# Patient Record
Sex: Male | Born: 1975 | Race: Black or African American | Hispanic: No | Marital: Single | Smoking: Current every day smoker
Health system: Southern US, Community
[De-identification: ages and names within clinical notes are randomized; demographics above are authoritative.]

## PROBLEM LIST (undated history)

## (undated) DIAGNOSIS — M25551 Pain in right hip: Secondary | ICD-10-CM

## (undated) DIAGNOSIS — E669 Obesity, unspecified: Secondary | ICD-10-CM

## (undated) DIAGNOSIS — G4730 Sleep apnea, unspecified: Secondary | ICD-10-CM

## (undated) DIAGNOSIS — R011 Cardiac murmur, unspecified: Secondary | ICD-10-CM

## (undated) DIAGNOSIS — M5441 Lumbago with sciatica, right side: Secondary | ICD-10-CM

## (undated) DIAGNOSIS — M722 Plantar fascial fibromatosis: Secondary | ICD-10-CM

## (undated) DIAGNOSIS — E559 Vitamin D deficiency, unspecified: Secondary | ICD-10-CM

## (undated) DIAGNOSIS — B009 Herpesviral infection, unspecified: Secondary | ICD-10-CM

## (undated) DIAGNOSIS — R002 Palpitations: Secondary | ICD-10-CM

## (undated) DIAGNOSIS — M1990 Unspecified osteoarthritis, unspecified site: Secondary | ICD-10-CM

## (undated) DIAGNOSIS — K921 Melena: Secondary | ICD-10-CM

## (undated) DIAGNOSIS — R7303 Prediabetes: Secondary | ICD-10-CM

## (undated) DIAGNOSIS — K219 Gastro-esophageal reflux disease without esophagitis: Secondary | ICD-10-CM

## (undated) HISTORY — DX: Plantar fascial fibromatosis: M72.2

## (undated) HISTORY — DX: Herpesviral infection, unspecified: B00.9

## (undated) HISTORY — PX: OTHER SURGICAL HISTORY: SHX169

## (undated) HISTORY — DX: Unspecified osteoarthritis, unspecified site: M19.90

## (undated) HISTORY — DX: Pain in right hip: M25.551

## (undated) HISTORY — DX: Gastro-esophageal reflux disease without esophagitis: K21.9

## (undated) HISTORY — PX: NO PAST SURGERIES: SHX2092

## (undated) HISTORY — DX: Cardiac murmur, unspecified: R01.1

## (undated) HISTORY — DX: Vitamin D deficiency, unspecified: E55.9

## (undated) HISTORY — DX: Sleep apnea, unspecified: G47.30

---
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
## 2011-02-17 ENCOUNTER — Emergency Department: Payer: Self-pay | Admitting: Unknown Physician Specialty

## 2014-04-14 ENCOUNTER — Inpatient Hospital Stay: Payer: Self-pay | Admitting: Internal Medicine

## 2014-04-14 LAB — URINALYSIS, COMPLETE
BILIRUBIN, UR: NEGATIVE
BLOOD: NEGATIVE
Bacteria: NONE SEEN
Glucose,UR: NEGATIVE mg/dL (ref 0–75)
KETONE: NEGATIVE
Leukocyte Esterase: NEGATIVE
Nitrite: NEGATIVE
Ph: 5 (ref 4.5–8.0)
Protein: NEGATIVE
RBC,UR: 1 /HPF (ref 0–5)
Specific Gravity: 1.021 (ref 1.003–1.030)
Squamous Epithelial: NONE SEEN
WBC UR: <1 /HPF (ref 0–5)

## 2014-04-14 LAB — COMPREHENSIVE METABOLIC PANEL
ANION GAP: 4 — AB (ref 7–16)
AST: 20 U/L (ref 15–37)
Albumin: 3.7 g/dL (ref 3.4–5.0)
Alkaline Phosphatase: 61 U/L
BILIRUBIN TOTAL: 0.2 mg/dL (ref 0.2–1.0)
BUN: 13 mg/dL (ref 7–18)
CHLORIDE: 107 mmol/L (ref 98–107)
CREATININE: 1.19 mg/dL (ref 0.60–1.30)
Calcium, Total: 8.1 mg/dL — ABNORMAL LOW (ref 8.5–10.1)
Co2: 26 mmol/L (ref 21–32)
EGFR (Non-African Amer.): >60
GLUCOSE: 101 mg/dL — AB (ref 65–99)
Osmolality: 274 (ref 275–301)
POTASSIUM: 3.9 mmol/L (ref 3.5–5.1)
SGPT (ALT): 21 U/L (ref 12–78)
Sodium: 137 mmol/L (ref 136–145)
Total Protein: 6.9 g/dL (ref 6.4–8.2)

## 2014-04-14 LAB — CBC
HCT: 42.8 % (ref 40.0–52.0)
HGB: 13.5 g/dL (ref 13.0–18.0)
MCH: 24.9 pg — ABNORMAL LOW (ref 26.0–34.0)
MCHC: 31.5 g/dL — AB (ref 32.0–36.0)
MCV: 79 fL — AB (ref 80–100)
Platelet: 139 10*3/uL — ABNORMAL LOW (ref 150–440)
RBC: 5.42 10*6/uL (ref 4.40–5.90)
RDW: 14.8 % — AB (ref 11.5–14.5)
WBC: 8.4 10*3/uL (ref 3.8–10.6)

## 2014-04-14 LAB — CSF CELL COUNT WITH DIFFERENTIAL
CSF Tube #: 3
EOS PCT: 0 %
Lymphocytes: 93 %
MONOCYTES/MACROPHAGES: 6 %
NEUTROS PCT: 1 %
Other Cells: 0 %
RBC (CSF): 0 /mm3
WBC (CSF): 201 /mm3

## 2014-04-14 LAB — PROTEIN, CSF: Protein, CSF: 171 mg/dL — ABNORMAL HIGH (ref 15–45)

## 2014-04-14 LAB — CK: CK, TOTAL: 159 U/L

## 2014-04-14 LAB — GLUCOSE, CSF: Glucose, CSF: 57 mg/dL (ref 40–75)

## 2014-04-14 LAB — TROPONIN I: Troponin-I: <0.02 ng/mL

## 2014-04-15 LAB — CBC WITH DIFFERENTIAL/PLATELET
Basophil #: 0 10*3/uL (ref 0.0–0.1)
Basophil %: 0.4 %
EOS ABS: 0 10*3/uL (ref 0.0–0.7)
Eosinophil %: 0.3 %
HCT: 40.8 % (ref 40.0–52.0)
HGB: 12.8 g/dL — ABNORMAL LOW (ref 13.0–18.0)
LYMPHS ABS: 2.5 10*3/uL (ref 1.0–3.6)
Lymphocyte %: 41.4 %
MCH: 24.9 pg — AB (ref 26.0–34.0)
MCHC: 31.3 g/dL — AB (ref 32.0–36.0)
MCV: 79 fL — ABNORMAL LOW (ref 80–100)
MONO ABS: 0.9 x10 3/mm (ref 0.2–1.0)
Monocyte %: 15.5 %
NEUTROS PCT: 42.4 %
Neutrophil #: 2.5 10*3/uL (ref 1.4–6.5)
Platelet: 128 10*3/uL — ABNORMAL LOW (ref 150–440)
RBC: 5.14 10*6/uL (ref 4.40–5.90)
RDW: 14.5 % (ref 11.5–14.5)
WBC: 6 10*3/uL (ref 3.8–10.6)

## 2014-04-15 LAB — BASIC METABOLIC PANEL
ANION GAP: 3 — AB (ref 7–16)
BUN: 8 mg/dL (ref 7–18)
CALCIUM: 8.1 mg/dL — AB (ref 8.5–10.1)
CHLORIDE: 108 mmol/L — AB (ref 98–107)
CREATININE: 1.08 mg/dL (ref 0.60–1.30)
Co2: 27 mmol/L (ref 21–32)
Glucose: 97 mg/dL (ref 65–99)
Osmolality: 274 (ref 275–301)
POTASSIUM: 3.7 mmol/L (ref 3.5–5.1)
Sodium: 138 mmol/L (ref 136–145)

## 2014-04-16 ENCOUNTER — Ambulatory Visit: Payer: Self-pay | Admitting: Neurology

## 2014-04-17 LAB — CSF CULTURE W GRAM STAIN

## 2015-01-23 ENCOUNTER — Emergency Department: Payer: Self-pay | Admitting: Emergency Medicine

## 2015-04-03 NOTE — Discharge Summary (Signed)
PATIENT NAME:  Brian Reyes, Brian Reyes MR#:  [id number] DATE OF BIRTH:  16-Apr-1976  DATE OF ADMISSION:  04/14/2014 DATE OF DISCHARGE:  04/17/2014  DISCHARGE DIAGNOSES:  1.  Viral meningitis. 2.  Headache.  DISCHARGE MEDICATIONS: 1.  Valtrex 1 g p.o. b.i.d. for 10 days. 2.  Tylenol as needed for headache.   CONSULTATIONS: Neurology.  DISCHARGE DIET: Regular.  HOSPITAL COURSE: A 39 year old male came because of severe headache and neck pain.  The patient also had some blurred vision. no  loss of consciousness. The patient admitted for possible meningitis. LP was done after head CT being negative. The patient's CSF analysis  did not have any cell count elevation. It showed a lymphocytic predominance, >.  Initially, he received vancomycin and Rocephin in the Emergency Room.   Because the bacterial cultures were negative, I removed isolation. The patient (continued to have headache,and CSF ,  is positive for HSV-2 DNA. We started him  on  acyclovir and  gave Valtrex 1 g p.o. b.i.d. for 10 days. The patient  seen by neurologist  Dr.Robert Charise Carwin..  The patient received 1 time order of Decadron 10 mg.  The patient was discharged home with Valtrex.     ____________________________ Epifanio Lesches, MD sk:sb D: 04/19/2014 22:40:05 ET T: 04/20/2014 07:37:09 ET JOB#: [id number]  cc: Epifanio Lesches, MD, <Dictator> Epifanio Lesches MD ELECTRONICALLY SIGNED 05/05/2014 22:06

## 2015-04-03 NOTE — Consult Note (Signed)
Referring Physician:  Loletha Grayer :   Reason for Consult: Admit Date: 14-Apr-2014  Chief Complaint: "My head hurts pretty bad. It's hard to move it"  Reason for Consult: meningitis   History of Present Illness: History of Present Illness:   Mr. Herder is a 39 yo man with no significant past medical history who presented to [facility] on 04/14/14 for evaluation of a 3-day history of progressively worsening headache and neck pain. This is associated with photophobia and mild fatigue. He denies any nausea, vomiting, or loss of consciousness. He denies any problems with thinking or memory, speech difficulties, diplopia, dysarthria, weakness or numbness. He has noticed that the pain is worse with neck flexion and rotation. He has not had any recent sick contacts. He presented to [facility] ED on 5/5, where initial workup included head CT which was negative for acute intracranial pathology. Lumbar puncture performed in the ED showed elevated WBC and protein with no RBC and normal glucose. He was admitted for further treatment of meningitis.   ROS:  Review of Systems   As per HPI. In addition, the patient denies fevers, chills, visual changes, chest discomfort, palpitations, cough, shortness of breath, nausea, vomiting, diarrhea, joint tenderness, or rashes. The remainder of a full 12-point review of systems is negative.   Past Medical/Surgical Hx:  denies:   Past Medical/ Surgical Hx:  Past Medical History none   Past Surgical History none   Home Medications: Medication Instructions Last Modified Date/Time  levofloxacin 750 mg oral tablet 1 tab(s) orally every 24 hours 07-May-15 07:47  acetaminophen/butalbital/caffeine 325 mg-50 mg-40 mg oral tablet 1 tab(s) orally every 4 hours, As Needed, headache , As needed, headache 07-May-15 07:47   Allergies:  No Known Allergies:   Social/Family History: Social History: Smokes 1ppd. Rare EtOH use. No illicit drugs.  Family History: Mother had cerebral  aneurysm. Father's medical history unknown.   Vital Signs: **Vital Signs.:   07-May-15 11:45  Vital Signs Type Q 4hr  Temperature Temperature (F) 98.7  Celsius 37  Temperature Source oral  Pulse Pulse 60  Respirations Respirations 18  Systolic BP Systolic BP 539  Diastolic BP (mmHg) Diastolic BP (mmHg) 70  Mean BP 88  Pulse Ox % Pulse Ox % 92  Pulse Ox Activity Level  At rest  Oxygen Delivery Room Air/ 21 %   EXAM: Well-developed, well-nourished, uncomfortable-appearing in dark room. No conjunctival injection or scleral edema. Oropharynx clear. No carotid bruits auscultated. Normal S1, S2 and regular cardiac rhythm on exam. Lungs clear to auscultation bilaterally. Abdomen soft and nontender. Peripheral pulses palpated. No clubbing, cyanosis, or edema in extremities.  Neck is stiff with pain and limited range on flexion. Kernig's sign is present.  MENTAL STATUS: Alert and oriented to person, place, and time. Language fluent and appropriate. Cognition and memory conversationally intact. CRANIAL NERVES: Visual fields full to confrontation. PERRL. Optic disc margins sharp on fundoscopic exam. EOMI. Facial sensation intact. Facial muscles full and symmetric. Hearing intact to finger rub. Uvula midline with symmetric palatal elevation. Tongue midline without fasciculations. MOTOR: Normal bulk and tone. Strength 5/5 in deltoids, biceps, triceps, wrist flexors and extensors, and hand intrinsics bilaterally. Strength 5/5 in iliopsoas, glutes, hamstrings, quads, and tib ant bilaterally. REFLEXES: 1+ in biceps, triceps, patella, and achilles bilaterally. Flexor plantar responses bilaterally. SENSORY: Intact to vibration and pinprick throughout. COORDINATION: No ataxia or dysmetria on finger-nose. Unable to perform heel-shin maneuevers due to pain. GAIT: Not tested..  Lab Results:  Hepatic:  05-May-15 09:49  Bilirubin, Total 0.2  Alkaline Phosphatase 61 (45-117 NOTE: New Reference  Range 10/31/13)  SGPT (ALT) 21  SGOT (AST) 20  Total Protein, Serum 6.9  Albumin, Serum 3.7  LabUnknown:  05-May-15 11:35   Result Interpretation CSF cytospin slide reviewed. I agree with the reported WBC differential. Blain Pais, MD.  Routine Micro:  05-May-15 11:35   Micro Text Report CSF CULTURE/AERO/GRAM ST   COMMENT                   NO GROWTH IN 48 HOURS   GRAM STAIN                FEW WHITE BLOOD CELLS   GRAM STAIN                RARE RED BLOOD CELLS   GRAM STAIN                NO ORGANISMS SEEN   ANTIBIOTIC          Culture Comment NO GROWTH IN 48 HOURS  Gram Stain 1 FEW WHITE BLOOD CELLS  Gram Stain 2 RARE RED BLOOD CELLS  Gram Stain 3 NO ORGANISMS SEEN  Result(s) reported on 16 Apr 2014 at 10:00AM.  Routine Chem:  05-May-15 11:35   Result Comment CSF - Cell count performed on Tube # 3.  - Differential performed on Tube #1-bgb  - PATHOLOGIST TO REVIEW SMEAR. COMMENTS  - APPEAR ON REPORT WHEN COMPLETE.  Result(s) reported on 14 Apr 2014 at 05:04PM.  06-May-15 04:36   Glucose, Serum 97  BUN 8  Creatinine (comp) 1.08  Sodium, Serum 138  Potassium, Serum 3.7  Chloride, Serum  108  CO2, Serum 27  Calcium (Total), Serum  8.1  Anion Gap  3  Osmolality (calc) 274  eGFR (African American) >60  eGFR (Non-African American) >60 (eGFR values <32m/min/1.73 m2 may be an indication of chronic kidney disease (CKD). Calculated eGFR is useful in patients with stable renal function. The eGFR calculation will not be reliable in acutely ill patients when serum creatinine is changing rapidly. It is not useful in  patients on dialysis. The eGFR calculation may not be applicable to patients at the low and high extremes of body sizes, pregnant women, and vegetarians.)  Cardiac:  05-May-15 09:49   Troponin I < 0.02 (0.00-0.05 0.05 ng/mL or less: NEGATIVE  Repeat testing in 3-6 hrs  if clinically indicated. >0.05 ng/mL: POTENTIAL  MYOCARDIAL INJURY. Repeat  testing in 3-6 hrs  if  clinically indicated. NOTE: An increase or decrease  of 30% or more on serial  testing suggests a  clinically important change)  CK, Total 159 (39-308 NOTE: NEW REFERENCE RANGE  01/12/2014)  Routine UA:  05-May-15 09:49   Color (UA) Yellow  Clarity (UA) Clear  Glucose (UA) Negative  Bilirubin (UA) Negative  Ketones (UA) Negative  Specific Gravity (UA) 1.021  Blood (UA) Negative  pH (UA) 5.0  Protein (UA) Negative  Nitrite (UA) Negative  Leukocyte Esterase (UA) Negative (Result(s) reported on 14 Apr 2014 at 11:36AM.)  RBC (UA) 1 /HPF  WBC (UA) <1 /HPF  Bacteria (UA) NONE SEEN  Epithelial Cells (UA) NONE SEEN  Mucous (UA) PRESENT (Result(s) reported on 14 Apr 2014 at 11:36AM.)  CSF Analysis:  05-May-15 11:35   Glucose, CSF 57 (Result(s) reported on 14 Apr 2014 at 12:37PM.)  Protein, CSF  171 (Result(s) reported on 14 Apr 2014 at 12:37PM.)  CSF Tube # 3  Color - Uncentrifuged (  CSF) COLORLESS  Clarity (CSF) CLEAR  RBC  (CSF) 0  WBC  (CSF) 201  Neutrophils  (CSF) 1  Lymphocytes  (CSF) 93  Monocytes/Macrophages  (CSF) 6  Eosinophil  (CSF) 0  Other Cells  (CSF) 0  Routine Hem:  06-May-15 04:36   WBC (CBC) 6.0  RBC (CBC) 5.14  Hemoglobin (CBC)  12.8  Hematocrit (CBC) 40.8  Platelet Count (CBC)  128  MCV  79  MCH  24.9  MCHC  31.3  RDW 14.5  Neutrophil % 42.4  Lymphocyte % 41.4  Monocyte % 15.5  Eosinophil % 0.3  Basophil % 0.4  Neutrophil # 2.5  Lymphocyte # 2.5  Monocyte # 0.9  Eosinophil # 0.0  Basophil # 0.0 (Result(s) reported on 15 Apr 2014 at 05:27AM.)   Radiology Results: CT:    05-May-15 09:20, CT Head Without Contrast  CT Head Without Contrast   REASON FOR EXAM:    Weakness, headache  COMMENTS:       PROCEDURE: CT  - CT HEAD WITHOUT CONTRAST  - Apr 14 2014  9:20AM     CLINICAL DATA:  Swelling to the head and sweating.  Weakness.    EXAM:  CT HEAD WITHOUT CONTRAST    TECHNIQUE:  Contiguous axial images were obtained from the base of  the skull  through the vertex without contrast.    COMPARISON:  None  FINDINGS:  Normal appearance of the intracranial structures. No evidence for  acute hemorrhage, mass lesion, midline shift, hydrocephalus or large  infarct. No acute bony abnormality. The visualized sinuses are  clear.     IMPRESSION:  No acute intracranial abnormality.      Electronically Signed    By: Rolla Flatten M.D.    On: 04/14/2014 09:19       Verified By: Staci Righter, M.D.,   Impression/Recommendations: Recommendations:   Mr. Fini is a 39 yo man with no notable PMH who presented with 3 days' headache and was found to have elevated WBC and protein on lumbar puncture. Neuroimaging and neurologic exam are normal (meningeal signs excepted). The lab findings of lymphocyte-predominant pleocytosis and elevated protein with normal glucose are consistent with viral meningitis. Most of these are due to a nonspecific enterovirus. Suspicion for HSV infection is fairly low due to absence of RBC or xanthochromia in CSF, though the consequences of this are so severe that I would recommend checking HSV 1&2 PCR and covering with acyclovir until this returns negative. Otherwise, there is no specific antiviral treatment for nonspecific viral meningitis. For pain control: generous IV hydration, NSAIDs, can consider one time dose of dexamethasone 59m IV for breakthrough painSend HSV 1&2 PCRStart acyclovir 172mkg Q8h x 10 days or until HSV PCR returns negative you for the opportunity to participate in Mr. JoSeverecare. Please call neurology consults with further questions. HeMar DaringMD  Electronic Signatures: HeCarmin RichmondMD)  (Signed 07-May-15 14:12)  Authored: REFERRING PHYSICIAN, Consult, History of Present Illness, Review of Systems, PAST MEDICAL/SURGICAL HISTORY, HOME MEDICATIONS, ALLERGIES, Social/Family History, NURSING VITAL SIGNS, Physical Exam-, LAB RESULTS, RADIOLOGY RESULTS, Recommendations   Last Updated:  07-May-15 14:12 by HeCarmin RichmondMD)

## 2015-04-03 NOTE — H&P (Signed)
PATIENT NAME:  Brian Reyes, Brian Reyes MR#:  [id number] DATE OF BIRTH:  1976-11-23  DATE OF ADMISSION:  04/14/2014  PRIMARY CARE PHYSICIAN:  None.  CHIEF COMPLAINT:  Headache.   HISTORY OF PRESENT ILLNESS:  This is a 39 year old man who states for the past three days he has had a headache, pain down his legs.  He has been feeling weak.  He cannot walk very well.  He has had blurry vision, light sensitivity and neck pain.  He came to the ER for further evaluation and had a lumbar puncture done by the ER physician.  The Gram stain showed no organisms.  We are still waiting on the differential, but verbal report that they did see white cells.  Hospitalist services were contacted for further evaluation.   PAST MEDICAL HISTORY:  None.   PAST SURGICAL HISTORY:  None.   ALLERGIES:  No known drug allergies.   MEDICATIONS:  Occasional Aleve.   SOCIAL HISTORY:  Smokes 1 pack per day.  Drinks one beer per day.  No drug use.  Works as a Biomedical scientist.   FAMILY HISTORY:  Father unknown.  Mother has aneurysm in the aorta that was repaired.  Also one in the neck and the head.   REVIEW OF SYSTEMS:  CONSTITUTIONAL:  Positive for chills.  Positive for sweating.  No fever.  Positive for weakness.  No weight gain.  No weight loss.  EYES:  Positive for blurry vision.  EARS, NOSE, MOUTH AND THROAT:  No sore throat.  Positive for dysphagia.  No hearing loss.  CARDIOVASCULAR:  Positive for chest pain.  RESPIRATORY:  Positive for cough.  No sputum.  No hemoptysis.  No shortness of breath.  GASTROINTESTINAL:  Positive for blood in the bowel movements.  No nausea.  No vomiting.  No abdominal pain.  No diarrhea.  No constipation.  GENITOURINARY:  No burning on urination.  No hematuria.  MUSCULOSKELETAL:  Positive for joint pain.  INTEGUMENT:  No rashes or eruptions.  NEUROLOGIC:  No fainting or blackouts.  PSYCHIATRIC:  No anxiety or depression.  ENDOCRINE:  No thyroid problems.  HEMATOLOGIC AND LYMPHATIC:  No anemia.   PHYSICAL  EXAMINATION: VITAL SIGNS:  On presentation to the Emergency Room included a pulse ox of 96% on room air.  Blood pressure 159/76, respirations 20, pulse 80, temperature 98.3.  GENERAL:  No respiratory distress.  EYES:  Conjunctivae and lids normal.  Pupils equal, round, and reactive to light.  Extraocular muscles intact.  No nystagmus.  Light sensitivity.  EARS, NOSE, MOUTH AND THROAT:  Tympanic membranes:  Slightly erythematous.  No bulging.  Nasal mucosa:  No erythema.  Throat:  No erythema.  No exudate seen.  Lips and gums:  No lesions.  NECK:  No JVD.  No bruits.  No lymphadenopathy.  No thyromegaly.  No thyroid nodules palpated.  Pain with movement of the neck.  Positive Kernig sign.  LUNGS:  Clear to auscultation.  No use of accessory muscles to breathe.  No rhonchi, rales, or wheeze heard.  CARDIOVASCULAR:  S1, S2 normal.  No gallops, rubs, or murmurs heard.  Carotid upstroke 2+ bilaterally.  No bruits.  EXTREMITIES:  Dorsalis pedis pulses 2+ bilaterally.  No edema of the lower extremity.  ABDOMEN:  Soft, nontender.  No organomegaly/splenomegaly.  Normoactive bowel sounds.  No masses felt.  LYMPHATIC:  No lymph nodes in the neck.  MUSCULOSKELETAL:  No clubbing, edema, or cyanosis.  Positive Kernig sign.  NEUROLOGIC:  Cranial nerves II through XII  grossly intact.  Deep tendon reflexes 1+ bilateral lower extremity.  Babinski equivocal.  PSYCHIATRIC:  The patient is alert, oriented to person, place, and time.   LABORATORY AND RADIOLOGICAL DATA:  White blood cell count 8.4, hemoglobin and hematocrit 13.5 and 42.8, platelet count of 139, glucose 101, BUN 13, creatinine 1.19, sodium 137, potassium 3.9, chloride 107, CO2 26, calcium 8.1.  Liver function tests normal range.  Troponin negative.  Urinalysis negative.  CPK negative.  CT scan of the head:  No acute intracranial abnormality.  Cerebral spinal fluid, a few white blood cells seen, rare red blood cells.  No organisms seen.  Glucose 57.  Protein  171.  Unfortunately, the differential and cell count is not back yet.  The lab is rerunning it.   ASSESSMENT AND PLAN: 1.  Meningitis with neck pain, headache and photophobia.  Still waiting the cell count and differential to determine whether this is a viral bacterial.  Emergency Room physician ordered Rocephin 2 grams and vancomycin 1 gram.  I will continue that until this differential is back.  Likely this is viral in nature with the patient's white blood cell count being normal and symptoms going on for three days.  2.  Tobacco abuse.  Smoking cessation counseling done three minutes by me.  Nicotine patch refused.  3.  Thrombocytopenia, likely viral in nature.   Time spent on admission 50 minutes.     ____________________________ Tana Conch. Leslye Peer, MD rjw:ea D: 04/14/2014 16:11:56 ET T: 04/14/2014 17:01:01 ET JOB#: [id number]  cc: Tana Conch. Leslye Peer, MD, <Dictator> Marisue Brooklyn MD ELECTRONICALLY SIGNED 04/19/2014 14:48

## 2015-04-25 ENCOUNTER — Emergency Department: Payer: Self-pay

## 2015-04-25 ENCOUNTER — Emergency Department
Admission: EM | Admit: 2015-04-25 | Discharge: 2015-04-25 | Disposition: A | Discharge status: Home / Self Care | Payer: Self-pay | Attending: Emergency Medicine | Admitting: Emergency Medicine

## 2015-04-25 ENCOUNTER — Encounter: Payer: Self-pay | Admitting: Emergency Medicine

## 2015-04-25 DIAGNOSIS — M791 Myalgia, unspecified site: Secondary | ICD-10-CM

## 2015-04-25 DIAGNOSIS — Z720 Tobacco use: Secondary | ICD-10-CM | POA: Insufficient documentation

## 2015-04-25 DIAGNOSIS — R5381 Other malaise: Secondary | ICD-10-CM | POA: Insufficient documentation

## 2015-04-25 DIAGNOSIS — M542 Cervicalgia: Secondary | ICD-10-CM | POA: Insufficient documentation

## 2015-04-25 DIAGNOSIS — R51 Headache: Secondary | ICD-10-CM | POA: Insufficient documentation

## 2015-04-25 DIAGNOSIS — R519 Headache, unspecified: Secondary | ICD-10-CM

## 2015-04-25 LAB — BASIC METABOLIC PANEL
Anion gap: 8 (ref 5–15)
BUN: 11 mg/dL (ref 6–20)
CALCIUM: 8.5 mg/dL — AB (ref 8.9–10.3)
CHLORIDE: 101 mmol/L (ref 101–111)
CO2: 25 mmol/L (ref 22–32)
Creatinine, Ser: 0.99 mg/dL (ref 0.61–1.24)
GFR calc Af Amer: >60 mL/min (ref >60)
GFR calc non Af Amer: >60 mL/min (ref >60)
GLUCOSE: 98 mg/dL (ref 65–99)
POTASSIUM: 3.3 mmol/L — AB (ref 3.5–5.1)
Sodium: 134 mmol/L — ABNORMAL LOW (ref 135–145)

## 2015-04-25 LAB — CBC WITH DIFFERENTIAL/PLATELET
BASOS PCT: 1 %
Basophils Absolute: 0.1 10*3/uL (ref 0–0.1)
Eosinophils Absolute: 0 10*3/uL (ref 0–0.7)
Eosinophils Relative: 1 %
HCT: 41.9 % (ref 40.0–52.0)
Hemoglobin: 13.2 g/dL (ref 13.0–18.0)
LYMPHS ABS: 2.3 10*3/uL (ref 1.0–3.6)
LYMPHS PCT: 33 %
MCH: 24.8 pg — AB (ref 26.0–34.0)
MCHC: 31.5 g/dL — AB (ref 32.0–36.0)
MCV: 78.6 fL — ABNORMAL LOW (ref 80.0–100.0)
Monocytes Absolute: 1 10*3/uL (ref 0.2–1.0)
Monocytes Relative: 14 %
Neutro Abs: 3.6 10*3/uL (ref 1.4–6.5)
Neutrophils Relative %: 51 %
Platelets: 134 10*3/uL — ABNORMAL LOW (ref 150–440)
RBC: 5.32 MIL/uL (ref 4.40–5.90)
RDW: 15.1 % — ABNORMAL HIGH (ref 11.5–14.5)
WBC: 6.9 10*3/uL (ref 3.8–10.6)

## 2015-04-25 LAB — URINALYSIS COMPLETE WITH MICROSCOPIC (ARMC ONLY)
BACTERIA UA: NONE SEEN
BILIRUBIN URINE: NEGATIVE
Glucose, UA: NEGATIVE mg/dL
Leukocytes, UA: NEGATIVE
NITRITE: NEGATIVE
PH: 5 (ref 5.0–8.0)
Protein, ur: NEGATIVE mg/dL
Specific Gravity, Urine: 1.025 (ref 1.005–1.030)

## 2015-04-25 LAB — POCT RAPID STREP A: Streptococcus, Group A Screen (Direct): NEGATIVE

## 2015-04-25 LAB — MONONUCLEOSIS SCREEN: MONO SCREEN: NEGATIVE

## 2015-04-25 MED ORDER — DIPHENHYDRAMINE HCL 50 MG/ML IJ SOLN
INTRAMUSCULAR | Status: AC
  Filled 2015-04-25: qty 1

## 2015-04-25 MED ORDER — CYCLOBENZAPRINE HCL 5 MG PO TABS: 5.0000 mg | ORAL_TABLET | Freq: Three times a day (TID) | ORAL | Status: DC | PRN

## 2015-04-25 MED ORDER — METHYLPREDNISOLONE SODIUM SUCC 125 MG IJ SOLR
125.0000 mg | Freq: Once | INTRAMUSCULAR | Status: AC
  Administered 2015-04-25: 125 mg via INTRAVENOUS

## 2015-04-25 MED ORDER — SODIUM CHLORIDE 0.9 % IV BOLUS (SEPSIS)
1000.0000 mL | Freq: Once | INTRAVENOUS | Status: AC
  Administered 2015-04-25: 1000 mL via INTRAVENOUS

## 2015-04-25 MED ORDER — METOCLOPRAMIDE HCL 5 MG/ML IJ SOLN
5.0000 mg | Freq: Once | INTRAMUSCULAR | Status: AC
  Administered 2015-04-25: 5 mg via INTRAVENOUS

## 2015-04-25 MED ORDER — KETOROLAC TROMETHAMINE 30 MG/ML IJ SOLN
30.0000 mg | Freq: Once | INTRAMUSCULAR | Status: AC
Start: 2015-04-25 — End: 2015-04-25
  Administered 2015-04-25: 30 mg via INTRAVENOUS

## 2015-04-25 MED ORDER — METOCLOPRAMIDE HCL 5 MG/ML IJ SOLN
INTRAMUSCULAR | Status: AC
  Filled 2015-04-25: qty 2

## 2015-04-25 MED ORDER — KETOROLAC TROMETHAMINE 30 MG/ML IJ SOLN
INTRAMUSCULAR | Status: AC
  Filled 2015-04-25: qty 1

## 2015-04-25 MED ORDER — DIPHENHYDRAMINE HCL 50 MG/ML IJ SOLN
25.0000 mg | Freq: Once | INTRAMUSCULAR | Status: AC
  Administered 2015-04-25: 25 mg via INTRAVENOUS

## 2015-04-25 MED ORDER — SUMATRIPTAN SUCCINATE 6 MG/0.5ML ~~LOC~~ SOLN
SUBCUTANEOUS | Status: AC
  Filled 2015-04-25: qty 0.5

## 2015-04-25 MED ORDER — SUMATRIPTAN SUCCINATE 6 MG/0.5ML ~~LOC~~ SOLN
6.0000 mg | Freq: Once | SUBCUTANEOUS | Status: AC
  Administered 2015-04-25: 6 mg via SUBCUTANEOUS

## 2015-04-25 MED ORDER — METHYLPREDNISOLONE SODIUM SUCC 125 MG IJ SOLR
INTRAMUSCULAR | Status: AC
  Filled 2015-04-25: qty 2

## 2015-04-25 MED ORDER — KETOROLAC TROMETHAMINE 10 MG PO TABS: 10.0000 mg | ORAL_TABLET | Freq: Three times a day (TID) | ORAL | Status: DC

## 2015-04-25 NOTE — Discharge Instructions (Signed)
General Headache Without Cause A general headache is pain or discomfort felt around the head or neck area. The cause may not be found.  HOME CARE   Keep all doctor visits.  Only take medicines as told by your doctor.  Lie down in a dark, quiet room when you have a headache.  Keep a journal to find out if certain things bring on headaches. For example, write down:  What you eat and drink.  How much sleep you get.  Any change to your diet or medicines.  Relax by getting a massage or doing other relaxing activities.  Put ice or heat packs on the head and neck area as told by your doctor.  Lessen stress.  Sit up straight. Do not tighten (tense) your muscles.  Quit smoking if you smoke.  Lessen how much alcohol you drink.  Lessen how much caffeine you drink, or stop drinking caffeine.  Eat and sleep on a regular schedule.  Get 7 to 9 hours of sleep, or as told by your doctor.  Keep lights dim if bright lights bother you or make your headaches worse. GET HELP RIGHT AWAY IF:   Your headache becomes really bad.  You have a fever.  You have a stiff neck.  You have trouble seeing.  Your muscles are weak, or you lose muscle control.  You lose your balance or have trouble walking.  You feel like you will pass out (faint), or you pass out.  You have really bad symptoms that are different than your first symptoms.  You have problems with the medicines given to you by your doctor.  Your medicines do not work.  Your headache feels different than the other headaches.  You feel sick to your stomach (nauseous) or throw up (vomit). MAKE SURE YOU:   Understand these instructions.  Will watch your condition.  Will get help right away if you are not doing well or get worse. Document Released: 09/05/2008 Document Revised: 02/19/2012 Document Reviewed: 11/17/2011 [facility] Patient Information 2015 [place], [place]. This information is not intended to replace advice given to  you by your health care provider. Make sure you discuss any questions you have with your health care provider.  Musculoskeletal Pain Musculoskeletal pain is muscle and boney aches and pains. These pains can occur in any part of the body. Your caregiver may treat you without knowing the cause of the pain. They may treat you if blood or urine tests, X-rays, and other tests were normal.  CAUSES There is often not a definite cause or reason for these pains. These pains may be caused by a type of germ (virus). The discomfort may also come from overuse. Overuse includes working out too hard when your body is not fit. Boney aches also come from weather changes. Bone is sensitive to atmospheric pressure changes. HOME CARE INSTRUCTIONS   Ask when your test results will be ready. Make sure you get your test results.  Only take over-the-counter or prescription medicines for pain, discomfort, or fever as directed by your caregiver. If you were given medications for your condition, do not drive, operate machinery or power tools, or sign legal documents for 24 hours. Do not drink alcohol. Do not take sleeping pills or other medications that may interfere with treatment.  Continue all activities unless the activities cause more pain. When the pain lessens, slowly resume normal activities. Gradually increase the intensity and duration of the activities or exercise.  During periods of severe pain, bed rest  may be helpful. Lay or sit in any position that is comfortable.  Putting ice on the injured area.  Put ice in a bag.  Place a towel between your skin and the bag.  Leave the ice on for 15 to 20 minutes, 3 to 4 times a day.  Follow up with your caregiver for continued problems and no reason can be found for the pain. If the pain becomes worse or does not go away, it may be necessary to repeat tests or do additional testing. Your caregiver may need to look further for a possible cause. SEEK IMMEDIATE MEDICAL  CARE IF:  You have pain that is getting worse and is not relieved by medications.  You develop chest pain that is associated with shortness or breath, sweating, feeling sick to your stomach (nauseous), or throw up (vomit).  Your pain becomes localized to the abdomen.  You develop any new symptoms that seem different or that concern you. MAKE SURE YOU:   Understand these instructions.  Will watch your condition.  Will get help right away if you are not doing well or get worse. Document Released: 11/27/2005 Document Revised: 02/19/2012 Document Reviewed: 08/01/2013 [facility] Patient Information 2015 [place], [place]. This information is not intended to replace advice given to you by your health care provider. Make sure you discuss any questions you have with your health care provider.  Your exam, labs, and CT scan was essentially normal today.  We did not identify a specific cause of your headache and muscle pains.  You should take the prescription meds as directed.  Rest and hydrate to regain your strength.  Follow-up with [facility] as needed.  Return to the ED as needed for worsening of symptoms.

## 2015-04-25 NOTE — ED Provider Notes (Signed)
[facility] Emergency Department Provider Note ____________________________________________  Time seen:  ----------------------------------------- 10:48 AM on 04/25/2015 -----------------------------------------  I have reviewed the triage vital signs and the nursing notes.  HISTORY  Chief Complaint Fever  HPI Brian Reyes is a 39 y.o. male who reports to the ED with complaints of headache and neck pain 3 days. He also reports intermittent fevers, noting a Tmax of 101. He reports in addition to his headache he's had generalized body aches and primarily noting myalgias down the back of the neck, across the shoulders, and between shoulder blades. He denies any sore throat, rash, cough, congestion, earache, GI symptoms, or URI symptoms. He denies any sick contacts, bad food or recent travel. He claims he has been dosing Aleve twice daily for the last 3 days without significant improvement in his headache pain. He does admit to poor appetite over the last 3 days as well noting he's only had some Gatorade today.  History reviewed. No pertinent past medical history.  There are no active problems to display for this patient.  History reviewed. No pertinent past surgical history.  Current Outpatient Rx  Name  Route  Sig  Dispense  Refill  . cyclobenzaprine (FLEXERIL) 5 MG tablet   Oral   Take 1 tablet (5 mg total) by mouth every 8 (eight) hours as needed for muscle spasms.   12 tablet   0   . ketorolac (TORADOL) 10 MG tablet   Oral   Take 1 tablet (10 mg total) by mouth every 8 (eight) hours.   15 tablet   0     Allergies Review of patient's allergies indicates no known allergies.  History reviewed. No pertinent family history.  Social History History  Substance Use Topics  . Smoking status: Current Some Day Smoker -- 0.50 packs/day    Types: Cigarettes  . Smokeless tobacco: Not on file  . Alcohol Use: 8.4 oz/week    14 Cans of beer per week    Review of Systems  Constitutional: Positive for intermittent fever. Eyes: Negative for visual changes. ENT: Negative for sore throat. Cardiovascular: Negative for chest pain. Respiratory: Negative for shortness of breath, cough, or wheeze. Gastrointestinal: Negative for abdominal pain, vomiting and diarrhea. Genitourinary: Negative for dysuria. Musculoskeletal: Negative for back pain. Positive for general muscle pain to the neck and upper back. Skin: Negative for rash. Neurological: Positive for headaches. Negative for focal weakness or numbness.  10-point ROS otherwise negative. ____________________________________________  PHYSICAL EXAM:  VITAL SIGNS: ED Triage Vitals  Enc Vitals Group     BP 04/25/15 0932 144/75 mmHg     Pulse Rate 04/25/15 0932 75     Resp 04/25/15 0932 18     Temp 04/25/15 0932 97.7 F (36.5 C)     Temp Source 04/25/15 0932 Oral     SpO2 04/25/15 0932 98 %     Weight 04/25/15 0932 230 lb (104.327 kg)     Height 04/25/15 0932 6' (1.829 m)     Head Cir --      Peak Flow --      Pain Score --      Pain Loc --      Pain Edu? --      Excl. in [place]? --    Constitutional: Alert and oriented. Well appearing and in no distress. Eyes: Conjunctivae are normal. PERRL. Normal extraocular movements. ENT   Head: Normocephalic and atraumatic.   Nose: No congestion/rhinnorhea.   Mouth/Throat: Mucous membranes are moist.  Neck: Supple. No cervical lymphadenopathy. Cardiovascular: Normal rate, regular rhythm. Normal and symmetric distal pulses are present in all extremities. No murmurs, rubs, or gallops. Respiratory: Normal respiratory effort without tachypnea nor retractions. Breath sounds are clear and equal bilaterally. No wheezes/rales/rhonchi. Gastrointestinal: Soft and nontender. No distention. No abdominal bruits. There is no CVA tenderness. Genitourinary: deferred Musculoskeletal: Nontender with normal range of motion in all extremities. No  joint effusions.  No lower extremity tenderness nor edema. Neurologic:  Normal speech and language. No gross focal neurologic deficits are appreciated. No gait instability. Skin:  Skin is warm, dry and intact. No rash noted. Psychiatric: Mood and affect are normal. Speech and behavior are normal. Patient exhibits appropriate insight and judgment. ____________________________________________   LABS (pertinent positives/negatives)  Labs Reviewed  CBC WITH DIFFERENTIAL/PLATELET - Abnormal; Notable for the following:    MCV 78.6 (*)    MCH 24.8 (*)    MCHC 31.5 (*)    RDW 15.1 (*)    Platelets 134 (*)    All other components within normal limits  BASIC METABOLIC PANEL - Abnormal; Notable for the following:    Sodium 134 (*)    Potassium 3.3 (*)    Calcium 8.5 (*)    All other components within normal limits  URINALYSIS COMPLETEWITH MICROSCOPIC ([facility])  - Abnormal; Notable for the following:    Color, Urine YELLOW (*)    APPearance CLEAR (*)    Ketones, ur 1+ (*)    Hgb urine dipstick 1+ (*)    Squamous Epithelial / LPF 0-5 (*)    All other components within normal limits  CULTURE, GROUP A STREP ([facility])  MONONUCLEOSIS SCREEN  B. BURGDORFI ANTIBODIES  POCT RAPID STREP A ([facility] ONLY)  POCT RAPID STREP A ([facility] ONLY)  ____________________________________________  RADIOLOGY  Head CT IMPRESSION: 1. No acute intracranial findings are observed. 2. Mildly expanded diploic space. Differential diagnosis includes normal variant, long-standing anemia, certain metabolic disorders including renal osteodystrophy and hypoparathyroidism, certain medications including phenytoin, and osteopetrosis. ____________________________________________  PROCEDURES  Procedure(s) performed: NS 1000 ml IV bolus x 2             Reglan 5 mg IV             Benadryl 25 mg IV             Toradol 30 mg IV             Imitrex 6 mg SQ             Solumedrol 125 mg IV  Critical Care performed:  No ___________________________________________  INITIAL IMPRESSION / ASSESSMENT AND PLAN / ED COURSE  Complaints of temporal & parietal headache, fevers, and generalized body aches. Normal exam, labs, and CT scan reported to patient.  Additional infection source testing including strep,mono, and lyme disease either negative or pending as noted. Patient reports near resolution of headache at discharge, eating a sandwich tray in the room without nausea & vomiting. Patient given instructions for return to the ED for recurrent or worsening symptoms.   Pertinent labs & imaging results that were available during my care of the patient were reviewed by me and considered in my medical decision making (see chart for details). ____________________________________________  FINAL CLINICAL IMPRESSION(S) / ED DIAGNOSES  Final diagnoses:  Headache in back of head  Malaise  Myalgia     Melvenia Needles, PA-C 04/25/15 1911  Dannielle Karvonen [place], PA-C 04/25/15 1912  Herbie [place]  Corky Downs, MD 04/26/15 [phone]

## 2015-04-25 NOTE — ED Notes (Signed)
Pt reports Headache for past 3 days. Pt reports pains travels down to his neck on both sides. Pt endores fever of 101. Pt states my whole body aches.

## 2015-04-25 NOTE — ED Notes (Signed)
Pt states he has had a HA for the last 3 days. Pt described HA as a pounding in his head, like "something is breathing in my head, on both sides of my head". Pt states pain 10/10. Denies blurry vision/nausea/vomitting, states "just no energy". Pt states he is also noted able to bend down or lean his head forward without worsening of his pain.

## 2015-04-27 LAB — B. BURGDORFI ANTIBODIES

## 2015-04-28 LAB — CULTURE, GROUP A STREP (THRC): SPECIAL REQUESTS: NORMAL

## 2015-05-27 LAB — CULTURE, GROUP A STREP (THRC): Special Requests: NORMAL

## 2017-08-07 ENCOUNTER — Encounter: Payer: Self-pay | Admitting: *Deleted

## 2017-08-07 ENCOUNTER — Ambulatory Visit
Admission: EM | Admit: 2017-08-07 | Discharge: 2017-08-07 | Disposition: A | Discharge status: Home / Self Care | Payer: Self-pay | Attending: Family Medicine | Admitting: Family Medicine

## 2017-08-07 DIAGNOSIS — Z5189 Encounter for other specified aftercare: Secondary | ICD-10-CM

## 2017-08-07 NOTE — ED Provider Notes (Signed)
[facility]    CSN: [id number] Arrival date & time: 08/07/17  0855     History   Chief Complaint Chief Complaint  Patient presents with  . Suture / Staple Removal    HPI Brian Reyes is a 41 y.o. male.   41 yo male here for laceration wound recheck and possible suture removal. Patient seen on 08/01/17 (6 days ago) at [facility] ED and had sutures placed. Patient states still feels a little bit tender but denies any drainage, redness or swelling.    The history is provided by the patient.    History reviewed. No pertinent past medical history.  There are no active problems to display for this patient.   History reviewed. No pertinent surgical history.     Home Medications    Prior to Admission medications   Medication Sig Start Date End Date Taking? Authorizing Provider  cyclobenzaprine (FLEXERIL) 5 MG tablet Take 1 tablet (5 mg total) by mouth every 8 (eight) hours as needed for muscle spasms. 04/25/15   Menshew, Dannielle Karvonen, PA-C  ketorolac (TORADOL) 10 MG tablet Take 1 tablet (10 mg total) by mouth every 8 (eight) hours. 04/25/15   Menshew, Dannielle Karvonen, PA-C    Family History History reviewed. No pertinent family history.  Social History Social History  Substance Use Topics  . Smoking status: Current Some Day Smoker    Packs/day: 0.50    Types: Cigarettes  . Smokeless tobacco: Never Used  . Alcohol use 8.4 oz/week    14 Cans of beer per week     Allergies   Patient has no known allergies.   Review of Systems Review of Systems   Physical Exam Triage Vital Signs ED Triage Vitals  Enc Vitals Group     BP 08/07/17 0949 135/72     Pulse Rate 08/07/17 0949 64     Resp 08/07/17 0949 16     Temp 08/07/17 0949 98.2 F (36.8 C)     Temp Source 08/07/17 0949 Oral     SpO2 08/07/17 0949 99 %     Weight 08/07/17 0950 230 lb (104.3 kg)     Height 08/07/17 0950 6' (1.829 m)     Head Circumference --      Peak Flow --      Pain Score 08/07/17  0952 0     Pain Loc --      Pain Edu? --      Excl. in [place]? --    No data found.   Updated Vital Signs BP 135/72 (BP Location: Left Arm)   Pulse 64   Temp 98.2 F (36.8 C) (Oral)   Resp 16   Ht 6' (1.829 m)   Wt 230 lb (104.3 kg)   SpO2 99%   BMI 31.19 kg/m   Visual Acuity Right Eye Distance:   Left Eye Distance:   Bilateral Distance:    Right Eye Near:   Left Eye Near:    Bilateral Near:     Physical Exam  Constitutional: He appears well-developed and well-nourished. No distress.  Skin: He is not diaphoretic.  Frontal scalp laceration with sutures in place; healing; no drainage or redness  Nursing note and vitals reviewed.    UC Treatments / Results  Labs (all labs ordered are listed, but only abnormal results are displayed) Labs Reviewed - No data to display  EKG  EKG Interpretation None       Radiology No results found.  Procedures  Procedures (including critical care time)  Medications Ordered in UC Medications - No data to display   Initial Impression / Assessment and Plan / UC Course  I have reviewed the triage vital signs and the nursing notes.  Pertinent labs & imaging results that were available during my care of the patient were reviewed by me and considered in my medical decision making (see chart for details).      Final Clinical Impressions(s) / UC Diagnoses   Final diagnoses:  Visit for wound check    New Prescriptions Discharge Medication List as of 08/07/2017 10:17 AM     Healing laceration wound. Continue routine sutured wound care.  Recommend follow up for suture removal in 2 days.   Controlled Substance Prescriptions [place] Controlled Substance Registry consulted? Not Applicable   Norval Gable, MD 08/07/17 [phone]

## 2017-08-07 NOTE — ED Triage Notes (Signed)
Suture removal. Suture placed on 08/01/17 at [facility] ED. The laceration is healing with no redness swelling or drainage.

## 2018-09-18 ENCOUNTER — Ambulatory Visit (INDEPENDENT_AMBULATORY_CARE_PROVIDER_SITE_OTHER): Payer: Managed Care, Other (non HMO)

## 2018-09-18 ENCOUNTER — Ambulatory Visit: Payer: Managed Care, Other (non HMO) | Admitting: Internal Medicine

## 2018-09-18 ENCOUNTER — Encounter: Payer: Self-pay | Admitting: Internal Medicine

## 2018-09-18 VITALS — BP 128/80 | HR 76 | Temp 98.9°F | Ht 72.0 in | Wt 250.2 lb

## 2018-09-18 DIAGNOSIS — Z1159 Encounter for screening for other viral diseases: Secondary | ICD-10-CM

## 2018-09-18 DIAGNOSIS — R002 Palpitations: Secondary | ICD-10-CM

## 2018-09-18 DIAGNOSIS — E559 Vitamin D deficiency, unspecified: Secondary | ICD-10-CM

## 2018-09-18 DIAGNOSIS — Z0184 Encounter for antibody response examination: Secondary | ICD-10-CM

## 2018-09-18 DIAGNOSIS — M5441 Lumbago with sciatica, right side: Secondary | ICD-10-CM

## 2018-09-18 DIAGNOSIS — M25551 Pain in right hip: Secondary | ICD-10-CM

## 2018-09-18 DIAGNOSIS — Z1329 Encounter for screening for other suspected endocrine disorder: Secondary | ICD-10-CM

## 2018-09-18 DIAGNOSIS — M722 Plantar fascial fibromatosis: Secondary | ICD-10-CM

## 2018-09-18 DIAGNOSIS — Z1389 Encounter for screening for other disorder: Secondary | ICD-10-CM

## 2018-09-18 DIAGNOSIS — D649 Anemia, unspecified: Secondary | ICD-10-CM

## 2018-09-18 DIAGNOSIS — Z13220 Encounter for screening for lipoid disorders: Secondary | ICD-10-CM

## 2018-09-18 DIAGNOSIS — Z113 Encounter for screening for infections with a predominantly sexual mode of transmission: Secondary | ICD-10-CM

## 2018-09-18 DIAGNOSIS — R739 Hyperglycemia, unspecified: Secondary | ICD-10-CM

## 2018-09-18 DIAGNOSIS — Z0000 Encounter for general adult medical examination without abnormal findings: Secondary | ICD-10-CM

## 2018-09-18 HISTORY — DX: Pain in right hip: M25.551

## 2018-09-18 HISTORY — DX: Palpitations: R00.2

## 2018-09-18 HISTORY — DX: Lumbago with sciatica, right side: M54.41

## 2018-09-18 NOTE — Addendum Note (Signed)
Addended by: Leeanne Rio on: 09/18/2018 03:36 PM   Modules accepted: Orders

## 2018-09-18 NOTE — Patient Instructions (Addendum)
Dr. Daneen Schick or Dr. Sherren Mocha palpitations heart doctor  In [facility]   Plantar Fasciitis Plantar fasciitis is a painful foot condition that affects the heel. It occurs when the band of tissue that connects the toes to the heel bone (plantar fascia) becomes irritated. This can happen after exercising too much or doing other repetitive activities (overuse injury). The pain from plantar fasciitis can range from mild irritation to severe pain that makes it difficult for you to walk or move. The pain is usually worse in the morning or after you have been sitting or lying down for a while. What are the causes? This condition may be caused by:  Standing for long periods of time.  Wearing shoes that do not fit.  Doing high-impact activities, including running, aerobics, and ballet.  Being overweight.  Having an abnormal way of walking (gait).  Having tight calf muscles.  Having high arches in your feet.  Starting a new athletic activity.  What are the signs or symptoms? The main symptom of this condition is heel pain. Other symptoms include:  Pain that gets worse after activity or exercise.  Pain that is worse in the morning or after resting.  Pain that goes away after you walk for a few minutes.  How is this diagnosed? This condition may be diagnosed based on your signs and symptoms. Your health care provider will also do a physical exam to check for:  A tender area on the bottom of your foot.  A high arch in your foot.  Pain when you move your foot.  Difficulty moving your foot.  You may also need to have imaging studies to confirm the diagnosis. These can include:  X-rays.  Ultrasound.  MRI.  How is this treated? Treatment for plantar fasciitis depends on the severity of the condition. Your treatment may include:  Rest, ice, and over-the-counter pain medicines to manage your pain.  Exercises to stretch your calves and your plantar fascia.  A splint that  holds your foot in a stretched, upward position while you sleep (night splint).  Physical therapy to relieve symptoms and prevent problems in the future.  Cortisone injections to relieve severe pain.  Extracorporeal shock wave therapy (ESWT) to stimulate damaged plantar fascia with electrical impulses. It is often used as a last resort before surgery.  Surgery, if other treatments have not worked after 12 months.  Follow these instructions at home:  Take medicines only as directed by your health care provider.  Avoid activities that cause pain.  Roll the bottom of your foot over a bag of ice or a bottle of cold water. Do this for 20 minutes, 3-4 times a day.  Perform simple stretches as directed by your health care provider.  Try wearing athletic shoes with air-sole or gel-sole cushions or soft shoe inserts.  Wear a night splint while sleeping, if directed by your health care provider.  Keep all follow-up appointments with your health care provider. How is this prevented?  Do not perform exercises or activities that cause heel pain.  Consider finding low-impact activities if you continue to have problems.  Lose weight if you need to. The best way to prevent plantar fasciitis is to avoid the activities that aggravate your plantar fascia. Contact a health care provider if:  Your symptoms do not go away after treatment with home care measures.  Your pain gets worse.  Your pain affects your ability to move or do your daily activities. This information is not  intended to replace advice given to you by your health care provider. Make sure you discuss any questions you have with your health care provider. Document Released: 08/22/2001 Document Revised: 05/01/2016 Document Reviewed: 10/07/2014 Elsevier Interactive Patient Education  [employer].

## 2018-09-18 NOTE — Progress Notes (Signed)
Chief Complaint  Patient presents with  . Establish Care   New patient  1. C/o heart fluttering chronically long term FH mom died MI 1. Denies anxiety  2. C/o right hip pain and numbness of right leg and ankle and h/o low back pain not currently flexeril did not help takes prn Ibuprofen for plantar fascitis  3. B/l heel pain seen Dr. Cleda Mccreedy 01/31/18 given foot pads and steroid shots but sx's returned he has not tried ice but takes ibuprofen prn   Review of Systems  Constitutional: Negative for weight loss.  HENT: Negative for hearing loss.   Eyes: Negative for blurred vision.  Respiratory: Negative for shortness of breath.   Cardiovascular: Positive for palpitations. Negative for chest pain.  Gastrointestinal: Negative for abdominal pain.  Musculoskeletal: Positive for back pain and joint pain.  Skin: Negative for rash.  Neurological: Negative for headaches.  Psychiatric/Behavioral: Negative for depression. The patient is not nervous/anxious.    Past Medical History:  Diagnosis Date  . Hip pain, acute, right   . Plantar fasciitis    Past Surgical History:  Procedure Laterality Date  . NO PAST SURGERIES     Family History  Problem Relation Age of Onset  . Heart disease Mother        age 49   . Hypertension Mother   . Diabetes Sister        DM 2    Social History   Socioeconomic History  . Marital status: Single    Spouse name: Not on file  . Number of children: Not on file  . Years of education: Not on file  . Highest education level: Not on file  Occupational History  . Not on file  Social Needs  . Financial resource strain: Not on file  . Food insecurity:    Worry: Not on file    Inability: Not on file  . Transportation needs:    Medical: Not on file    Non-medical: Not on file  Tobacco Use  . Smoking status: Current Some Day Smoker    Packs/day: 0.50    Types: Cigarettes  . Smokeless tobacco: Never Used  Substance and Sexual Activity  . Alcohol use: Yes      Alcohol/week: 14.0 standard drinks    Types: 14 Cans of beer per week  . Drug use: No  . Sexual activity: Yes  Lifestyle  . Physical activity:    Days per week: Not on file    Minutes per session: Not on file  . Stress: Not on file  Relationships  . Social connections:    Talks on phone: Not on file    Gets together: Not on file    Attends religious service: Not on file    Active member of club or organization: Not on file    Attends meetings of clubs or organizations: Not on file    Relationship status: Not on file  . Intimate partner violence:    Fear of current or ex partner: Not on file    Emotionally abused: Not on file    Physically abused: Not on file    Forced sexual activity: Not on file  Other Topics Concern  . Not on file  Social History Narrative   [employer]    4 kids 1 bio    Married    No guns    Wears seat belt    Safe in relationship    Current Meds  Medication Sig  .  ibuprofen (ADVIL,MOTRIN) 600 MG tablet Take by mouth.   No Known Allergies No results found for this or any previous visit (from the past 2160 hour(s)). Objective  Body mass index is 33.93 kg/m. Wt Readings from Last 3 Encounters:  09/18/18 250 lb 3.2 oz (113.5 kg)  08/07/17 230 lb (104.3 kg)  04/25/15 230 lb (104.3 kg)   Temp Readings from Last 3 Encounters:  09/18/18 98.9 F (37.2 C) (Oral)  08/07/17 98.2 F (36.8 C) (Oral)  04/25/15 98.7 F (37.1 C) (Oral)   BP Readings from Last 3 Encounters:  09/18/18 128/80  08/07/17 135/72  04/25/15 (!) 148/100   Pulse Readings from Last 3 Encounters:  09/18/18 76  08/07/17 64  04/25/15 98    Physical Exam  Constitutional: He is oriented to person, place, and time. Vital signs are normal. He appears well-developed and well-nourished. He is cooperative.  HENT:  Head: Normocephalic and atraumatic.  Mouth/Throat: Oropharynx is clear and moist and mucous membranes are normal.  Eyes: Pupils are equal, round, and  reactive to light. Conjunctivae are normal.  Cardiovascular: Normal rate, regular rhythm and normal heart sounds.  No murmur heard. Pulmonary/Chest: Effort normal and breath sounds normal.  Musculoskeletal:       Right hip: He exhibits tenderness.       Arms: Neurological: He is alert and oriented to person, place, and time. Gait normal.  Skin: Skin is warm, dry and intact.  Psychiatric: He has a normal mood and affect. His speech is normal and behavior is normal. Judgment and thought content normal. Cognition and memory are normal.  Nursing note and vitals reviewed.   Assessment   1. Palpitations  2. Plantar fascitis noted 01/2018 Xrays  3. Right hip pain and right leg numbness  R/o lumbar radiculopathy  4. HM Plan   1. Refer to Dr. Sherren Mocha or Dr. Daneen Schick in [place]  2. Refer back to Dr. Cleda Mccreedy Info given stretches  3. Xray low back and right hip today  4.  Declines flu shot  Tdap had 08/01/17  Check fasting labs upcoming  rec smoking cessation <1 ppd x 30 years no FH lung cancer    Provider: Dr. Olivia Mackie [facility]

## 2018-10-01 ENCOUNTER — Other Ambulatory Visit: Payer: Self-pay | Admitting: Internal Medicine

## 2018-10-01 ENCOUNTER — Other Ambulatory Visit (INDEPENDENT_AMBULATORY_CARE_PROVIDER_SITE_OTHER): Payer: Managed Care, Other (non HMO)

## 2018-10-01 DIAGNOSIS — Z113 Encounter for screening for infections with a predominantly sexual mode of transmission: Secondary | ICD-10-CM

## 2018-10-01 DIAGNOSIS — Z0184 Encounter for antibody response examination: Secondary | ICD-10-CM

## 2018-10-01 DIAGNOSIS — R002 Palpitations: Secondary | ICD-10-CM

## 2018-10-01 DIAGNOSIS — Z1329 Encounter for screening for other suspected endocrine disorder: Secondary | ICD-10-CM

## 2018-10-01 DIAGNOSIS — Z0000 Encounter for general adult medical examination without abnormal findings: Secondary | ICD-10-CM

## 2018-10-01 DIAGNOSIS — R739 Hyperglycemia, unspecified: Secondary | ICD-10-CM

## 2018-10-01 DIAGNOSIS — Z13220 Encounter for screening for lipoid disorders: Secondary | ICD-10-CM

## 2018-10-01 DIAGNOSIS — E559 Vitamin D deficiency, unspecified: Secondary | ICD-10-CM | POA: Diagnosis not present

## 2018-10-01 DIAGNOSIS — Z1159 Encounter for screening for other viral diseases: Secondary | ICD-10-CM

## 2018-10-01 DIAGNOSIS — Z1389 Encounter for screening for other disorder: Secondary | ICD-10-CM

## 2018-10-01 DIAGNOSIS — D649 Anemia, unspecified: Secondary | ICD-10-CM

## 2018-10-02 ENCOUNTER — Other Ambulatory Visit: Payer: Self-pay | Admitting: Internal Medicine

## 2018-10-02 DIAGNOSIS — E559 Vitamin D deficiency, unspecified: Secondary | ICD-10-CM | POA: Insufficient documentation

## 2018-10-02 LAB — LIPID PANEL
CHOL/HDL RATIO: 3.2 ratio (ref 0.0–5.0)
Cholesterol, Total: 152 mg/dL (ref 100–199)
HDL: 47 mg/dL (ref >39)
LDL Calculated: 84 mg/dL (ref 0–99)
TRIGLYCERIDES: 104 mg/dL (ref 0–149)
VLDL CHOLESTEROL CAL: 21 mg/dL (ref 5–40)

## 2018-10-02 LAB — CBC WITH DIFFERENTIAL/PLATELET
Basophils Absolute: 0 x10E3/uL (ref 0.0–0.2)
Basos: 1 %
EOS (ABSOLUTE): 0.1 x10E3/uL (ref 0.0–0.4)
Eos: 2 %
Hematocrit: 44.3 % (ref 37.5–51.0)
Hemoglobin: 14 g/dL (ref 13.0–17.7)
Immature Grans (Abs): 0 x10E3/uL (ref 0.0–0.1)
Immature Granulocytes: 0 %
Lymphocytes Absolute: 2.8 x10E3/uL (ref 0.7–3.1)
Lymphs: 44 %
MCH: 24.9 pg — ABNORMAL LOW (ref 26.6–33.0)
MCHC: 31.6 g/dL (ref 31.5–35.7)
MCV: 79 fL (ref 79–97)
Monocytes Absolute: 0.7 x10E3/uL (ref 0.1–0.9)
Monocytes: 12 %
Neutrophils Absolute: 2.6 x10E3/uL (ref 1.4–7.0)
Neutrophils: 41 %
Platelets: 197 x10E3/uL (ref 150–450)
RBC: 5.62 x10E6/uL (ref 4.14–5.80)
RDW: 14.3 % (ref 12.3–15.4)
WBC: 6.4 x10E3/uL (ref 3.4–10.8)

## 2018-10-02 LAB — URINALYSIS, ROUTINE W REFLEX MICROSCOPIC
Bilirubin, UA: NEGATIVE
Glucose, UA: NEGATIVE
Ketones, UA: NEGATIVE
Leukocytes, UA: NEGATIVE
Nitrite, UA: NEGATIVE
Protein, UA: NEGATIVE
RBC, UA: NEGATIVE
Specific Gravity, UA: 1.018 (ref 1.005–1.030)
Urobilinogen, Ur: 0.2 mg/dL (ref 0.2–1.0)
pH, UA: 5 (ref 5.0–7.5)

## 2018-10-02 LAB — COMPREHENSIVE METABOLIC PANEL
A/G RATIO: 1.8 (ref 1.2–2.2)
ALBUMIN: 4.3 g/dL (ref 3.5–5.5)
ALT: 16 IU/L (ref 0–44)
AST: 14 IU/L (ref 0–40)
Alkaline Phosphatase: 77 IU/L (ref 39–117)
BUN / CREAT RATIO: 18 (ref 9–20)
BUN: 18 mg/dL (ref 6–24)
CO2: 24 mmol/L (ref 20–29)
Calcium: 9.5 mg/dL (ref 8.7–10.2)
Chloride: 106 mmol/L (ref 96–106)
Creatinine, Ser: 0.99 mg/dL (ref 0.76–1.27)
GFR, EST AFRICAN AMERICAN: 108 mL/min/{1.73_m2} (ref >59)
GFR, EST NON AFRICAN AMERICAN: 94 mL/min/{1.73_m2} (ref >59)
GLOBULIN, TOTAL: 2.4 g/dL (ref 1.5–4.5)
Glucose: 97 mg/dL (ref 65–99)
POTASSIUM: 4 mmol/L (ref 3.5–5.2)
SODIUM: 144 mmol/L (ref 134–144)
TOTAL PROTEIN: 6.7 g/dL (ref 6.0–8.5)

## 2018-10-02 LAB — HIV ANTIBODY (ROUTINE TESTING W REFLEX): HIV SCREEN 4TH GENERATION: NONREACTIVE

## 2018-10-02 LAB — HSV 2 ANTIBODY, IGG: HSV 2 IGG, TYPE SPEC: 7.03 {index} — AB (ref 0.00–0.90)

## 2018-10-02 LAB — T4, FREE: Free T4: 1.12 ng/dL (ref 0.82–1.77)

## 2018-10-02 LAB — RPR: RPR Ser Ql: NONREACTIVE

## 2018-10-02 LAB — IRON,TIBC AND FERRITIN PANEL
FERRITIN: 200 ng/mL (ref 30–400)
Iron Saturation: 33 % (ref 15–55)
Iron: 93 ug/dL (ref 38–169)
TIBC: 282 ug/dL (ref 250–450)
UIBC: 189 ug/dL (ref 111–343)

## 2018-10-02 LAB — VITAMIN D 25 HYDROXY (VIT D DEFICIENCY, FRACTURES): Vit D, 25-Hydroxy: 10.3 ng/mL — ABNORMAL LOW (ref 30.0–100.0)

## 2018-10-02 LAB — HEPATITIS C ANTIBODY: Hep C Virus Ab: <0.1 s/co ratio (ref 0.0–0.9)

## 2018-10-02 LAB — TSH: TSH: 1.08 u[IU]/mL (ref 0.450–4.500)

## 2018-10-02 LAB — MEASLES/MUMPS/RUBELLA IMMUNITY
MUMPS ABS, IGG: 19.1 [AU]/ml (ref >10.9)
RUBEOLA AB, IGG: >300 AU/mL (ref >16.4)
Rubella Antibodies, IGG: 3.82 index (ref >0.99)

## 2018-10-02 LAB — HSV 1 ANTIBODY, IGG: HSV 1 Glycoprotein G Ab, IgG: <0.91 index (ref 0.00–0.90)

## 2018-10-02 LAB — HEPATITIS B SURFACE ANTIBODY, QUANTITATIVE

## 2018-10-02 LAB — HEMOGLOBIN A1C
Est. average glucose Bld gHb Est-mCnc: 120 mg/dL
HEMOGLOBIN A1C: 5.8 % — AB (ref 4.8–5.6)

## 2018-10-02 MED ORDER — CHOLECALCIFEROL 1.25 MG (50000 UT) PO CAPS: 50000.0000 [IU] | ORAL_CAPSULE | ORAL | 1 refills | Status: DC

## 2018-10-15 LAB — SPECIMEN STATUS REPORT

## 2018-10-18 ENCOUNTER — Telehealth: Payer: Self-pay | Admitting: *Deleted

## 2018-10-18 NOTE — Telephone Encounter (Signed)
I spoke with [facility] today & they stated that no test code/order was originally with the specimen. Once it was received the specimen was too old to urn. I assured them that that was not true, as his labs  Urine order was sent the same time.  The patient will need to repeat urine test if it is still needed.

## 2018-11-05 ENCOUNTER — Other Ambulatory Visit (HOSPITAL_COMMUNITY)
Admission: RE | Admit: 2018-11-05 | Discharge: 2018-11-05 | Disposition: A | Discharge status: Home / Self Care | Payer: Managed Care, Other (non HMO) | Source: Ambulatory Visit | Attending: Family Medicine | Admitting: Family Medicine

## 2018-11-05 ENCOUNTER — Encounter: Payer: Self-pay | Admitting: Family Medicine

## 2018-11-05 ENCOUNTER — Ambulatory Visit: Payer: Managed Care, Other (non HMO) | Admitting: Family Medicine

## 2018-11-05 VITALS — BP 138/82 | HR 59 | Temp 98.8°F | Ht 72.0 in | Wt 251.0 lb

## 2018-11-05 DIAGNOSIS — R309 Painful micturition, unspecified: Secondary | ICD-10-CM | POA: Insufficient documentation

## 2018-11-05 LAB — POCT URINALYSIS DIPSTICK
BILIRUBIN UA: NEGATIVE
GLUCOSE UA: NEGATIVE
Ketones, UA: NEGATIVE
Leukocytes, UA: NEGATIVE
Nitrite, UA: NEGATIVE
Protein, UA: NEGATIVE
RBC UA: NEGATIVE
Spec Grav, UA: >=1.03 — AB (ref 1.010–1.025)
Urobilinogen, UA: 0.2 E.U./dL
pH, UA: 6 (ref 5.0–8.0)

## 2018-11-05 LAB — URINALYSIS, MICROSCOPIC ONLY: RBC / HPF: NONE SEEN (ref 0–?)

## 2018-11-05 NOTE — Patient Instructions (Signed)
   Drink enough fluid to keep your urine clear or pale yellow.  Empty your bladder often. Avoid holding urine for long periods of time.  Empty your bladder after sexual intercourse.  Avoid caffeine, tea, and alcohol. They can irritate the bladder and make dysuria worse. In men, alcohol may irritate the prostate.   Keep all follow-up visits as directed by your health care provider. This is important.  If you had any tests done to find the cause of dysuria, it is your responsibility to obtain your test results. Ask the lab or department performing the test when and how you will get your results. Talk with your health care provider if you have any questions about your results.

## 2018-11-05 NOTE — Progress Notes (Signed)
   Subjective:    Patient ID: Brian Reyes, male    DOB: [birth year], 42 y.o.   MRN: [id number]  HPI   Patient presents to clinic complaining of burning with urination off and on for the past 1 to 2 weeks.  Patient denies any abdominal pain, denies nausea, vomiting or diarrhea.  Patient denies any low back pain.  Patient denies any possibility of STD.  Patient states he does drink a lot of caffeine, usually drinks coffee and "Bang" energy drinks daily.  Patient denies any discharge from penis, denies any testicular pain.  Patient Active Problem List   Diagnosis Date Noted  . Vitamin D deficiency 10/02/2018  . Plantar fasciitis 09/18/2018  . Right hip pain 09/18/2018  . Acute right-sided low back pain with right-sided sciatica 09/18/2018  . Heart palpitations 09/18/2018   Social History   Tobacco Use  . Smoking status: Current Some Day Smoker    Packs/day: 0.50    Types: Cigarettes  . Smokeless tobacco: Never Used  Substance Use Topics  . Alcohol use: Yes    Alcohol/week: 14.0 standard drinks    Types: 14 Cans of beer per week   Review of Systems  Constitutional: Negative for chills, fatigue and fever.  HENT: Negative for congestion, ear pain, sinus pain and sore throat.   Eyes: Negative.   Respiratory: Negative for cough, shortness of breath and wheezing.   Cardiovascular: Negative for chest pain, palpitations and leg swelling.  Gastrointestinal: Negative for abdominal pain, diarrhea, nausea and vomiting.  Genitourinary: +burning with urination off/on  Musculoskeletal: Negative for arthralgias and myalgias.  Skin: Negative for color change, pallor and rash.  Neurological: Negative for syncope, light-headedness and headaches.  Psychiatric/Behavioral: The patient is not nervous/anxious.       Objective:   Physical Exam   Constitutional: He appears well-developed and well-nourished. No distress.  HENT:  Head: Normocephalic and atraumatic.  Eyes: Conjunctivae and EOM are  normal. No scleral icterus.  Neck: Normal range of motion. Neck supple. No tracheal deviation present.  Cardiovascular: Normal rate, regular rhythm and normal heart sounds.  Pulmonary/Chest: Effort normal and breath sounds normal. No respiratory distress. He has no wheezes. He has no rales.  Abdominal: Soft. Bowel sounds are normal. There is no tenderness.  Neurological: He is alert and oriented to person, place, and time. Gait normal  Skin: Skin is warm and dry. He is not diaphoretic. No pallor.  Psychiatric: He has a normal mood and affect. His behavior is normal. Thought content normal.   Nursing note and vitals reviewed.    Vitals:   11/05/18 1009  BP: 138/82  Pulse: (!) 59  Temp: 98.8 F (37.1 C)  SpO2: 97%    Assessment & Plan:   Pain while urinating - urinalysis is unremarkable other than being concentrated.  We will send urine culture to lab, we will also do urinalysis micro and urine for STD testing.  Patient advised to cut back on the caffeine and increase his water intake.  Patient educated that caffeine can often cause irritation with urination so cutting back should help his symptoms improve.  If the rest of his testing all comes back negative, and he is still having burning with urination off and on we can consider urology referral at that time.  Keep regularly scheduled follow-up with PCP as planned.  Return to clinic sooner if any issues arise or if current symptoms persist or worsen.

## 2018-11-06 LAB — URINE CULTURE
MICRO NUMBER:: 91424708
Result:: NO GROWTH
SPECIMEN QUALITY:: ADEQUATE

## 2018-11-06 LAB — URINE CYTOLOGY ANCILLARY ONLY
CHLAMYDIA, DNA PROBE: NEGATIVE
NEISSERIA GONORRHEA: NEGATIVE
TRICH (WINDOWPATH): NEGATIVE

## 2018-12-10 ENCOUNTER — Telehealth: Payer: Self-pay | Admitting: Interventional Cardiology

## 2018-12-10 NOTE — Telephone Encounter (Signed)
Called patient, but, VM was not set up so could not leave a VM.

## 2018-12-19 ENCOUNTER — Encounter: Payer: Self-pay | Admitting: Internal Medicine

## 2018-12-19 ENCOUNTER — Ambulatory Visit: Payer: Managed Care, Other (non HMO) | Admitting: Internal Medicine

## 2018-12-19 VITALS — BP 116/78 | HR 58 | Temp 97.7°F | Ht 72.0 in | Wt 252.4 lb

## 2018-12-19 DIAGNOSIS — E669 Obesity, unspecified: Secondary | ICD-10-CM

## 2018-12-19 DIAGNOSIS — R7303 Prediabetes: Secondary | ICD-10-CM

## 2018-12-19 DIAGNOSIS — M160 Bilateral primary osteoarthritis of hip: Secondary | ICD-10-CM

## 2018-12-19 DIAGNOSIS — R002 Palpitations: Secondary | ICD-10-CM

## 2018-12-19 DIAGNOSIS — E559 Vitamin D deficiency, unspecified: Secondary | ICD-10-CM

## 2018-12-19 DIAGNOSIS — B009 Herpesviral infection, unspecified: Secondary | ICD-10-CM | POA: Insufficient documentation

## 2018-12-19 HISTORY — DX: Herpesviral infection, unspecified: B00.9

## 2018-12-19 HISTORY — DX: Prediabetes: R73.03

## 2018-12-19 HISTORY — DX: Obesity, unspecified: E66.9

## 2018-12-19 MED ORDER — DICLOFENAC SODIUM 1 % TD GEL: 4.0000 g | Freq: Four times a day (QID) | TRANSDERMAL | 11 refills | Status: DC

## 2018-12-19 NOTE — Progress Notes (Signed)
Chief Complaint  Patient presents with  . Follow-up   F/u  1. Dysuria improved urine stds and UA neg  2. C/o R>L hip pain and mild OA noted on Xray hips b/l R>L and back normal Xray taking Ibuprofen 1800 mg daily worse after working  3. Palpitations pt needs to call Dr. Daneen Schick to schedule f/u  4. Reviewed labs    Review of Systems  Constitutional: Negative for weight loss.  Eyes: Negative for blurred vision.  Respiratory: Negative for shortness of breath.   Cardiovascular: Negative for chest pain.  Musculoskeletal: Positive for joint pain.  Skin: Negative for rash.  Neurological: Negative for headaches.  Psychiatric/Behavioral: Negative for depression.   Past Medical History:  Diagnosis Date  . Hip pain, acute, right   . Plantar fasciitis    Past Surgical History:  Procedure Laterality Date  . NO PAST SURGERIES     Family History  Problem Relation Age of Onset  . Heart disease Mother        age 76   . Hypertension Mother   . Diabetes Sister        DM 2    Social History   Socioeconomic History  . Marital status: Single    Spouse name: Not on file  . Number of children: Not on file  . Years of education: Not on file  . Highest education level: Not on file  Occupational History  . Not on file  Social Needs  . Financial resource strain: Not on file  . Food insecurity:    Worry: Not on file    Inability: Not on file  . Transportation needs:    Medical: Not on file    Non-medical: Not on file  Tobacco Use  . Smoking status: Current Some Day Smoker    Packs/day: 0.50    Types: Cigarettes  . Smokeless tobacco: Never Used  Substance and Sexual Activity  . Alcohol use: Yes    Alcohol/week: 14.0 standard drinks    Types: 14 Cans of beer per week  . Drug use: No  . Sexual activity: Yes  Lifestyle  . Physical activity:    Days per week: Not on file    Minutes per session: Not on file  . Stress: Not on file  Relationships  . Social connections:     Talks on phone: Not on file    Gets together: Not on file    Attends religious service: Not on file    Active member of club or organization: Not on file    Attends meetings of clubs or organizations: Not on file    Relationship status: Not on file  . Intimate partner violence:    Fear of current or ex partner: Not on file    Emotionally abused: Not on file    Physically abused: Not on file    Forced sexual activity: Not on file  Other Topics Concern  . Not on file  Social History Narrative   [employer]    4 kids 1 bio    Married    No guns    Wears seat belt    Safe in relationship    Former marine    Current Meds  Medication Sig  . Cholecalciferol 50000 units capsule Take 1 capsule (50,000 Units total) by mouth once a week.  Marland Kitchen ibuprofen (ADVIL,MOTRIN) 600 MG tablet Take by mouth.   No Known Allergies Recent Results (from the past 2160 hour(s))  Specimen status  report     Status: None   Collection Time: 10/01/18 12:00 AM  Result Value Ref Range   specimen status report Comment     Comment: Written Authorization Written Authorization Written Authorization Received. Authorization received from Hillman davis 10-03-2018 Logged by Lurena Nida   Vitamin D (25 hydroxy)     Status: Abnormal   Collection Time: 10/01/18  8:54 AM  Result Value Ref Range   Vit D, 25-Hydroxy 10.3 (L) 30.0 - 100.0 ng/mL    Comment: Vitamin D deficiency has been defined by the [place] practice guideline as a level of serum 25-OH vitamin D less than 20 ng/mL (1,2). The Endocrine Society went on to further define vitamin D insufficiency as a level between 21 and 29 ng/mL (2). 1. IOM (Institute of Medicine). 2010. Dietary reference    intakes for calcium and D. [place]: The    [employer]. 2. Holick MF, Binkley Hannaford, Bischoff-Ferrari HA, et al.    Evaluation, treatment, and prevention of vitamin D    deficiency: an Endocrine Society  clinical practice    guideline. JCEM. 2011 Jul; 96(7):1911-30.   Iron, TIBC and Ferritin Panel     Status: None   Collection Time: 10/01/18  8:54 AM  Result Value Ref Range   Total Iron Binding Capacity 282 250 - 450 ug/dL   UIBC 189 111 - 343 ug/dL   Iron 93 38 - 169 ug/dL   Iron Saturation 33 15 - 55 %   Ferritin 200 30 - 400 ng/mL  RPR     Status: None   Collection Time: 10/01/18  8:54 AM  Result Value Ref Range   RPR Ser Ql Non Reactive Non Reactive  HIV antibody (with reflex)     Status: None   Collection Time: 10/01/18  8:54 AM  Result Value Ref Range   HIV Screen 4th Generation wRfx Non Reactive Non Reactive  Hepatitis C antibody     Status: None   Collection Time: 10/01/18  8:54 AM  Result Value Ref Range   Hep C Virus Ab <0.1 0.0 - 0.9 s/co ratio    Comment:                                   Negative:     < 0.8                              Indeterminate: 0.8 - 0.9                                   Positive:     > 0.9  The CDC recommends that a positive HCV antibody result  be followed up with a HCV Nucleic Acid Amplification  test (433295).   Hepatitis B surface antibody,quantitative     Status: Abnormal   Collection Time: 10/01/18  8:54 AM  Result Value Ref Range   Hepatitis B Surf Ab Quant <3.1 (L) Immunity>9.9 mIU/mL    Comment:   Status of Immunity                     Anti-HBs Level   ------------------                     --------------  Inconsistent with Immunity                   0.0 - 9.9 Consistent with Immunity                          >9.9   HSV 1 antibody, IgG     Status: None   Collection Time: 10/01/18  8:54 AM  Result Value Ref Range   HSV 1 Glycoprotein G Ab, IgG <0.91 0.00 - 0.90 index    Comment:                                  Negative        <0.91                                  Equivocal 0.91 - 1.09                                  Positive        >1.09  Note: Negative indicates no antibodies detected to  HSV-1. Equivocal may suggest early  infection.  If  clinically appropriate, retest at later date. Positive  indicates antibodies detected to HSV-1.   HSV 2 antibody, IgG     Status: Abnormal   Collection Time: 10/01/18  8:54 AM  Result Value Ref Range   HSV 2 IgG, Type Spec 7.03 (H) 0.00 - 0.90 index    Comment:                                  Negative        <0.91                                  Equivocal 0.91 - 1.09                                  Positive        >1.09  Note: Negative indicates no antibodies detected to  HSV-2. Equivocal may suggest early infection.  If  clinically appropriate, retest at later date. Positive  indicates antibodies detected to HSV-2.   Measles/Mumps/Rubella Immunity     Status: None   Collection Time: 10/01/18  8:54 AM  Result Value Ref Range   Rubella Antibodies, IGG 3.82 Immune >0.99 index    Comment:                                 Non-immune       <0.90                                 Equivocal  0.90 - 0.99                                 Immune           >0.99  RUBEOLA AB, IGG >300.0 Immune >16.4 AU/mL    Comment:                                  Negative        <13.5                                  Equivocal 13.5 - 16.4                                  Positive        >16.4 Presence of antibodies to Rubeola is presumptive evidence of immunity except when acute infection is suspected.    MUMPS ABS, IGG 19.1 Immune >10.9 AU/mL    Comment:                                 Negative         <9.0                                 Equivocal  9.0 - 10.9                                 Positive        >10.9 A positive result generally indicates past exposure to Mumps virus or previous vaccination.   Comprehensive metabolic panel     Status: None   Collection Time: 10/01/18  8:54 AM  Result Value Ref Range   Glucose 97 65 - 99 mg/dL   BUN 18 6 - 24 mg/dL   Creatinine, Ser 0.99 0.76 - 1.27 mg/dL   GFR calc non Af Amer 94 >59 mL/min/1.73   GFR calc Af Amer 108 >59 mL/min/1.73    BUN/Creatinine Ratio 18 9 - 20   Sodium 144 134 - 144 mmol/L   Potassium 4.0 3.5 - 5.2 mmol/L   Chloride 106 96 - 106 mmol/L   CO2 24 20 - 29 mmol/L   Calcium 9.5 8.7 - 10.2 mg/dL   Total Protein 6.7 6.0 - 8.5 g/dL   Albumin 4.3 3.5 - 5.5 g/dL   Globulin, Total 2.4 1.5 - 4.5 g/dL   Albumin/Globulin Ratio 1.8 1.2 - 2.2   Bilirubin Total <0.2 0.0 - 1.2 mg/dL   Alkaline Phosphatase 77 39 - 117 IU/L   AST 14 0 - 40 IU/L   ALT 16 0 - 44 IU/L  CBC with Differential/Platelet     Status: Abnormal   Collection Time: 10/01/18  8:54 AM  Result Value Ref Range   WBC 6.4 3.4 - 10.8 x10E3/uL   RBC 5.62 4.14 - 5.80 x10E6/uL   Hemoglobin 14.0 13.0 - 17.7 g/dL   Hematocrit 44.3 37.5 - 51.0 %   MCV 79 79 - 97 fL   MCH 24.9 (L) 26.6 - 33.0 pg   MCHC 31.6 31.5 - 35.7 g/dL   RDW 14.3 12.3 - 15.4 %   Platelets 197 150 - 450 x10E3/uL   Neutrophils 41 Not Estab. %   Lymphs 44 Not Estab. %   Monocytes 12 Not Estab. %   Eos  2 Not Estab. %   Basos 1 Not Estab. %   Neutrophils Absolute 2.6 1.4 - 7.0 x10E3/uL   Lymphocytes Absolute 2.8 0.7 - 3.1 x10E3/uL   Monocytes Absolute 0.7 0.1 - 0.9 x10E3/uL   EOS (ABSOLUTE) 0.1 0.0 - 0.4 x10E3/uL   Basophils Absolute 0.0 0.0 - 0.2 x10E3/uL   Immature Granulocytes 0 Not Estab. %   Immature Grans (Abs) 0.0 0.0 - 0.1 x10E3/uL  Lipid panel     Status: None   Collection Time: 10/01/18  8:54 AM  Result Value Ref Range   Cholesterol, Total 152 100 - 199 mg/dL   Triglycerides 104 0 - 149 mg/dL   HDL 47 >39 mg/dL   VLDL Cholesterol Cal 21 5 - 40 mg/dL   LDL Calculated 84 0 - 99 mg/dL   Chol/HDL Ratio 3.2 0.0 - 5.0 ratio    Comment:                                   T. Chol/HDL Ratio                                             Men  Women                               1/2 Avg.Risk  3.4    3.3                                   Avg.Risk  5.0    4.4                                2X Avg.Risk  9.6    7.1                                3X Avg.Risk 23.4   11.0    Urinalysis, Routine w reflex microscopic     Status: None   Collection Time: 10/01/18  8:54 AM  Result Value Ref Range   Specific Gravity, UA 1.018 1.005 - 1.030   pH, UA 5.0 5.0 - 7.5   Color, UA Yellow Yellow   Appearance Ur Clear Clear   Leukocytes, UA Negative Negative   Protein, UA Negative Negative/Trace   Glucose, UA Negative Negative   Ketones, UA Negative Negative   RBC, UA Negative Negative   Bilirubin, UA Negative Negative   Urobilinogen, Ur 0.2 0.2 - 1.0 mg/dL   Nitrite, UA Negative Negative   Microscopic Examination Comment     Comment: Microscopic not indicated and not performed.  TSH     Status: None   Collection Time: 10/01/18  8:54 AM  Result Value Ref Range   TSH 1.080 0.450 - 4.500 uIU/mL  T4, free     Status: None   Collection Time: 10/01/18  8:54 AM  Result Value Ref Range   Free T4 1.12 0.82 - 1.77 ng/dL  Hemoglobin A1c     Status: Abnormal   Collection Time: 10/01/18  8:54 AM  Result Value Ref Range   Hgb A1c MFr  Bld 5.8 (H) 4.8 - 5.6 %    Comment:          Prediabetes: 5.7 - 6.4          Diabetes: >6.4          Glycemic control for adults with diabetes: <7.0    Est. average glucose Bld gHb Est-mCnc 120 mg/dL  Urine cytology ancillary only([place])     Status: None   Collection Time: 11/05/18 12:00 AM  Result Value Ref Range   Chlamydia Negative     Comment: Normal Reference Range - Negative   Neisseria gonorrhea Negative     Comment: Normal Reference Range - Negative   Trichomonas Negative     Comment: Normal Reference Range - Negative  Urine Culture     Status: None   Collection Time: 11/05/18 10:26 AM  Result Value Ref Range   MICRO NUMBER: [id number]    SPECIMEN QUALITY: Adequate    Sample Source URINE    STATUS: FINAL    Result: No Growth   Urine Microscopic Only     Status: None   Collection Time: 11/05/18 10:26 AM  Result Value Ref Range   WBC, UA 0-2/hpf 0-2/hpf   RBC / HPF none seen 0-2/hpf   Squamous Epithelial / LPF  Rare(0-4/hpf) Rare(0-4/hpf)  POCT urinalysis dipstick     Status: Abnormal   Collection Time: 11/05/18 10:37 AM  Result Value Ref Range   Color, UA yellow    Clarity, UA clear    Glucose, UA Negative Negative   Bilirubin, UA neg    Ketones, UA neg    Spec Grav, UA >=1.030 (A) 1.010 - 1.025   Blood, UA neg    pH, UA 6.0 5.0 - 8.0   Protein, UA Negative Negative   Urobilinogen, UA 0.2 0.2 or 1.0 E.U./dL   Nitrite, UA neg    Leukocytes, UA Negative Negative   Appearance     Odor     Objective  Body mass index is 34.23 kg/m. Wt Readings from Last 3 Encounters:  12/19/18 252 lb 6.4 oz (114.5 kg)  11/05/18 251 lb (113.9 kg)  09/18/18 250 lb 3.2 oz (113.5 kg)   Temp Readings from Last 3 Encounters:  12/19/18 97.7 F (36.5 C) (Oral)  11/05/18 98.8 F (37.1 C) (Oral)  09/18/18 98.9 F (37.2 C) (Oral)   BP Readings from Last 3 Encounters:  12/19/18 116/78  11/05/18 138/82  09/18/18 128/80   Pulse Readings from Last 3 Encounters:  12/19/18 (!) 58  11/05/18 (!) 59  09/18/18 76    Physical Exam Vitals signs and nursing note reviewed.  Constitutional:      Appearance: Normal appearance. He is well-developed. He is obese.  HENT:     Head: Normocephalic and atraumatic.     Mouth/Throat:     Mouth: Mucous membranes are moist.     Pharynx: Oropharynx is clear.  Eyes:     Conjunctiva/sclera: Conjunctivae normal.     Pupils: Pupils are equal, round, and reactive to light.  Cardiovascular:     Rate and Rhythm: Regular rhythm. Bradycardia present.     Heart sounds: Normal heart sounds.  Pulmonary:     Effort: Pulmonary effort is normal.     Breath sounds: Normal breath sounds.  Skin:    General: Skin is warm and dry.  Neurological:     General: No focal deficit present.     Mental Status: He is alert and oriented to person, place, and  time.     Gait: Gait normal.  Psychiatric:        Attention and Perception: Attention and perception normal.        Mood and Affect:  Mood and affect normal.        Speech: Speech normal.        Behavior: Behavior normal. Behavior is cooperative.        Thought Content: Thought content normal.        Cognition and Memory: Cognition and memory normal.        Judgment: Judgment normal.     Assessment   1. 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 Dysuria resolved  3. Prediabetes/obesity BMI >34  4. Palpitations  5. HM Plan   1. Disc supplements GC/tumeric  Trial of voltaren  Sparingly NSAIDS, prn Tylenol  Consider ortho referral prn  2. ntd  3. rec healthy diet and exercise  4. Given # to call Dr. Daneen Schick 5.  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 Olivia Mackie McLean-Scocuzza-[facility]

## 2018-12-19 NOTE — Progress Notes (Signed)
Pre visit review using our clinic review tool, if applicable. No additional management support is needed unless otherwise documented below in the visit note. 

## 2018-12-19 NOTE — Patient Instructions (Addendum)
Tylenol 500 up to 6x per day  Glucosamine/chrondrotin supplement  Tumeric capsules=anti inflammatory  Consider Celebrex for arthritis pain  Max dose Ibuprofen 2400 mg per day  Consider orthopedic referral Dr. Roland Rack in [place] in the future    Vitamin D3 take 5000 IU daily starting in May 2020 over the counter    Results for Brian Reyes, Brian Reyes (MRN [id number]) as of 12/19/2018 10:41  Ref. Range 10/01/2018 08:54  Hemoglobin A1C Latest Ref Range: 4.8 - 5.6 % 5.8 (H)  You have prediabetes need to eat healthy and exercise to prevent diabetes if you A1C was 5.6 no prediabetes    Exercising to Lose Weight Exercise is structured, repetitive physical activity to improve fitness and health. Getting regular exercise is important for everyone. It is especially important if you are overweight. Being overweight increases your risk of heart disease, stroke, diabetes, high blood pressure, and several types of cancer. Reducing your calorie intake and exercising can help you lose weight. Exercise is usually categorized as moderate or vigorous intensity. To lose weight, most people need to do a certain amount of moderate-intensity or vigorous-intensity exercise each week. Moderate-intensity exercise  Moderate-intensity exercise is any activity that gets you moving enough to burn at least three times more energy (calories) than if you were sitting. Examples of moderate exercise include:  Walking a mile in 15 minutes.  Doing light yard work.  Biking at an easy pace. Most people should get at least 150 minutes (2 hours and 30 minutes) a week of moderate-intensity exercise to maintain their body weight. Vigorous-intensity exercise Vigorous-intensity exercise is any activity that gets you moving enough to burn at least six times more calories than if you were sitting. When you exercise at this intensity, you should be working hard enough that you are not able to carry on a conversation. Examples of vigorous  exercise include:  Running.  Playing a team sport, such as football, basketball, and soccer.  Jumping rope. Most people should get at least 75 minutes (1 hour and 15 minutes) a week of vigorous-intensity exercise to maintain their body weight. How can exercise affect me? When you exercise enough to burn more calories than you eat, you lose weight. Exercise also reduces body fat and builds muscle. The more muscle you have, the more calories you burn. Exercise also:  Improves mood.  Reduces stress and tension.  Improves your overall fitness, flexibility, and endurance.  Increases bone strength. The amount of exercise you need to lose weight depends on:  Your age.  The type of exercise.  Any health conditions you have.  Your overall physical ability. Talk to your health care provider about how much exercise you need and what types of activities are safe for you. What actions can I take to lose weight? Nutrition   Make changes to your diet as told by your health care provider or diet and nutrition specialist (dietitian). This may include: ? Eating fewer calories. ? Eating more protein. ? Eating less unhealthy fats. ? Eating a diet that includes fresh fruits and vegetables, whole grains, low-fat dairy products, and lean protein. ? Avoiding foods with added fat, salt, and sugar.  Drink plenty of water while you exercise to prevent dehydration or heat stroke. Activity  Choose an activity that you enjoy and set realistic goals. Your health care provider can help you make an exercise plan that works for you.  Exercise at a moderate or vigorous intensity most days of the week. ? The intensity of exercise  may vary from person to person. You can tell how intense a workout is for you by paying attention to your breathing and heartbeat. Most people will notice their breathing and heartbeat get faster with more intense exercise.  Do resistance training twice each week, such  as: ? Push-ups. ? Sit-ups. ? Lifting weights. ? Using resistance bands.  Getting short amounts of exercise can be just as helpful as long structured periods of exercise. If you have trouble finding time to exercise, try to include exercise in your daily routine. ? Get up, stretch, and walk around every 30 minutes throughout the day. ? Go for a walk during your lunch break. ? Park your car farther away from your destination. ? If you take public transportation, get off one stop early and walk the rest of the way. ? Make phone calls while standing up and walking around. ? Take the stairs instead of elevators or escalators.  Wear comfortable clothes and shoes with good support.  Do not exercise so much that you hurt yourself, feel dizzy, or get very short of breath. Where to find more information  U.S. Department of Health and Human Services: [employer].at  Centers for Disease Control and Prevention (CDC): http://www.wolf.info/ Contact a health care provider:  Before starting a new exercise program.  If you have questions or concerns about your weight.  If you have a medical problem that keeps you from exercising. Get help right away if you have any of the following while exercising:  Injury.  Dizziness.  Difficulty breathing or shortness of breath that does not go away when you stop exercising.  Chest pain.  Rapid heartbeat. Summary  Being overweight increases your risk of heart disease, stroke, diabetes, high blood pressure, and several types of cancer.  Losing weight happens when you burn more calories than you eat.  Reducing the amount of calories you eat in addition to getting regular moderate or vigorous exercise each week helps you lose weight. This information is not intended to replace advice given to you by your health care provider. Make sure you discuss any questions you have with your health care provider. Document Released: 12/30/2010 Document Revised: 12/10/2017  Document Reviewed: 12/10/2017 Elsevier Interactive Patient Education  2019 Elsevier Inc.   Vitamin D Deficiency Vitamin D deficiency is when your body does not have enough vitamin D. Vitamin D is important to your body for many reasons:  It helps the body to absorb two important minerals, called calcium and phosphorus.  It plays a role in bone health.  It may help to prevent some diseases, such as diabetes and multiple sclerosis.  It plays a role in muscle function, including heart function. You can get vitamin D by:  Eating foods that naturally contain vitamin D.  Eating or drinking milk or other dairy products that have vitamin D added to them.  Taking a vitamin D supplement or a multivitamin supplement that contains vitamin D.  Being in the sun. Your body naturally makes vitamin D when your skin is exposed to sunlight. Your body changes the sunlight into a form of the vitamin that the body can use. If vitamin D deficiency is severe, it can cause a condition in which your bones become soft. In adults, this condition is called osteomalacia. In children, this condition is called rickets. What are the causes? Vitamin D deficiency may be caused by:  Not eating enough foods that contain vitamin D.  Not getting enough sun exposure.  Having certain digestive system  diseases that make it difficult for your body to absorb vitamin D. These diseases include Crohn disease, chronic pancreatitis, and cystic fibrosis.  Having a surgery in which a part of the stomach or a part of the small intestine is removed.  Being obese.  Having chronic kidney disease or liver disease. What increases the risk? This condition is more likely to develop in:  Older people.  People who do not spend much time outdoors.  People who live in a long-term care facility.  People who have had broken bones.  People with weak or thin bones (osteoporosis).  People who have a disease or condition that  changes how the body absorbs vitamin D.  People who have dark skin.  People who take certain medicines, such as steroid medicines or certain seizure medicines.  People who are overweight or obese. What are the signs or symptoms? In mild cases of vitamin D deficiency, there may not be any symptoms. If the condition is severe, symptoms may include:  Bone pain.  Muscle pain.  Falling often.  Broken bones caused by a minor injury. How is this diagnosed? This condition is usually diagnosed with a blood test. How is this treated? Treatment for this condition may depend on what caused the condition. Treatment options include:  Taking vitamin D supplements.  Taking a calcium supplement. Your health care provider will suggest what dose is best for you. Follow these instructions at home:  Take medicines and supplements only as told by your health care provider.  Eat foods that contain vitamin D. Choices include: ? Fortified dairy products, cereals, or juices. Fortified means that vitamin D has been added to the food. Check the label on the package to be sure. ? Fatty fish, such as salmon or trout. ? Eggs. ? Oysters.  Do not use a tanning bed.  Maintain a healthy weight. Lose weight, if needed.  Keep all follow-up visits as told by your health care provider. This is important. Contact a health care provider if:  Your symptoms do not go away.  You feel like throwing up (nausea) or you throw up (vomit).  You have fewer bowel movements than usual or it is difficult for you to have a bowel movement (constipation). This information is not intended to replace advice given to you by your health care provider. Make sure you discuss any questions you have with your health care provider. Document Released: 02/19/2012 Document Revised: 05/10/2016 Document Reviewed: 04/14/2015 Elsevier Interactive Patient Education  2019 [place].   Celecoxib capsules What is this  medicine? CELECOXIB (sell a KOX ib) is a non-steroidal anti-inflammatory drug (NSAID). This medicine is used to treat arthritis and ankylosing spondylitis. It may be also used for pain or painful monthly periods. This medicine may be used for other purposes; ask your health care provider or pharmacist if you have questions. COMMON BRAND NAME(S): Celebrex What should I tell my health care provider before I take this medicine? They need to know if you have any of these conditions: -asthma -coronary artery bypass graft (CABG) surgery within the past 2 weeks -drink more than 3 alcohol-containing drinks a day -heart disease or circulation problems like heart failure or leg edema (fluid retention) -high blood pressure -kidney disease -liver disease -stomach bleeding or ulcers -an unusual or allergic reaction to celecoxib, sulfa drugs, aspirin, other NSAIDs, other medicines, foods, dyes, or preservatives -pregnant or trying to get pregnant -breast-feeding How should I use this medicine? Take this medicine by mouth with a  full glass of water. Follow the directions on the prescription label. Take it with food if it upsets your stomach or if you take 400 mg at one time. Try to not lie down for at least 10 minutes after you take the medicine. Take the medicine at the same time each day. Do not take more medicine than you are told to take. Long-term, continuous use may increase the risk of heart attack or stroke. A special MedGuide will be given to you by the pharmacist with each prescription and refill. Be sure to read this information carefully each time. Talk to your pediatrician regarding the use of this medicine in children. Special care may be needed. Overdosage: If you think you have taken too much of this medicine contact a poison control center or emergency room at once. NOTE: This medicine is only for you. Do not share this medicine with others. What if I miss a dose? If you miss a dose, take  it as soon as you can. If it is almost time for your next dose, take only that dose. Do not take double or extra doses. What may interact with this medicine? Do not take this medicine with any of the following medications: -cidofovir -methotrexate -other NSAIDs, medicines for pain and inflammation, like ibuprofen or naproxen -pemetrexed This medicine may also interact with the following medications: -alcohol -aspirin and aspirin-like drugs -diuretics -fluconazole -lithium -medicines for high blood pressure -steroid medicines like prednisone or cortisone -warfarin This list may not describe all possible interactions. Give your health care provider a list of all the medicines, herbs, non-prescription drugs, or dietary supplements you use. Also tell them if you smoke, drink alcohol, or use illegal drugs. Some items may interact with your medicine. What should I watch for while using this medicine? Tell your doctor or health care professional if your pain does not get better. Talk to your doctor before taking another medicine for pain. Do not treat yourself. This medicine does not prevent heart attack or stroke. In fact, this medicine may increase the chance of a heart attack or stroke. The chance may increase with longer use of this medicine and in people who have heart disease. If you take aspirin to prevent heart attack or stroke, talk with your doctor or health care professional. Do not take medicines such as ibuprofen and naproxen with this medicine. Side effects such as stomach upset, nausea, or ulcers may be more likely to occur. Many medicines available without a prescription should not be taken with this medicine. This medicine can cause ulcers and bleeding in the stomach and intestines at any time during treatment. Ulcers and bleeding can happen without warning symptoms and can cause death. What side effects may I notice from receiving this medicine? Side effects that you should report  to your doctor or health care professional as soon as possible: -allergic reactions like skin rash, itching or hives, swelling of the face, lips, or tongue -black or bloody stools, blood in the urine or vomit -blurred vision -breathing problems -chest pain -nausea, vomiting -problems with balance, talking, walking -redness, blistering, peeling or loosening of the skin, including inside the mouth -unexplained weight gain or swelling -unusually weak or tired -yellowing of eyes, skin Side effects that usually do not require medical attention (report to your doctor or health care professional if they continue or are bothersome): -constipation or diarrhea -dizziness -gas or heartburn -upset stomach This list may not describe all possible side effects. Call your doctor for medical  advice about side effects. You may report side effects to FDA at 1-800-FDA-1088. Where should I keep my medicine? Keep out of the reach of children. Store at room temperature between 15 and 30 degrees C (59 and 86 degrees F). Keep container tightly closed. Throw away any unused medicine after the expiration date. NOTE: This sheet is a summary. It may not cover all possible information. If you have questions about this medicine, talk to your doctor, pharmacist, or health care provider.  2019 Elsevier/Gold Standard (2010-01-26 10:54:17)    Arthritis Arthritis is a term that is commonly used to refer to joint pain or joint disease. There are more than 100 types of arthritis. What are the causes? The most common cause of this condition is wear and tear of a joint. Other causes include:  Gout.  Inflammation of a joint.  An infection of a joint.  Sprains and other injuries near the joint.  A drug reaction or allergic reaction. In some cases, the cause may not be known. What are the signs or symptoms? The main symptom of this condition is pain in the joint with movement. Other symptoms include:  Redness,  swelling, or stiffness at a joint.  Warmth coming from the joint.  Fever.  Overall feeling of illness. How is this diagnosed? This condition may be diagnosed with a physical exam and tests, including:  Blood tests.  Urine tests.  Imaging tests, such as MRI, X-rays, or a CT scan. Sometimes, fluid is removed from a joint for testing. How is this treated? Treatment for this condition may involve:  Treatment of the cause, if it is known.  Rest.  Raising (elevating) the joint.  Applying cold or hot packs to the joint.  Medicines to improve symptoms and reduce inflammation.  Injections of a steroid such as cortisone into the joint to help reduce pain and inflammation. Depending on the cause of your arthritis, you may need to make lifestyle changes to reduce stress on your joint. These changes may include exercising more and losing weight. Follow these instructions at home: Medicines  Take over-the-counter and prescription medicines only as told by your health care provider.  Do not take aspirin to relieve pain if gout is suspected. Activity  Rest your joint if told by your health care provider. Rest is important when your disease is active and your joint feels painful, swollen, or stiff.  Avoid activities that make the pain worse. It is important to balance activity with rest.  Exercise your joint regularly with range-of-motion exercises as told by your health care provider. Try doing low-impact exercise, such as: ? Swimming. ? Water aerobics. ? Biking. ? Walking. Joint Care   If your joint is swollen, keep it elevated if told by your health care provider.  If your joint feels stiff in the morning, try taking a warm shower.  If directed, apply heat to the joint. If you have diabetes, do not apply heat without permission from your health care provider. ? Put a towel between the joint and the hot pack or heating pad. ? Leave the heat on the area for 20-30  minutes.  If directed, apply ice to the joint: ? Put ice in a plastic bag. ? Place a towel between your skin and the bag. ? Leave the ice on for 20 minutes, 2-3 times per day.  Keep all follow-up visits as told by your health care provider. This is important. Contact a health care provider if:  The pain gets worse.  You have a fever. Get help right away if:  You develop severe joint pain, swelling, or redness.  Many joints become painful and swollen.  You develop severe back pain.  You develop severe weakness in your leg.  You cannot control your bladder or bowels. This information is not intended to replace advice given to you by your health care provider. Make sure you discuss any questions you have with your health care provider. Document Released: 01/04/2005 Document Revised: 05/04/2016 Document Reviewed: 02/22/2015 Elsevier Interactive Patient Education  2019 [employer].

## 2019-02-20 IMAGING — DX DG LUMBAR SPINE COMPLETE 4+V
5 series · 5 of 5 positions shown · non-contrast
Comparison: None

CLINICAL DATA: Low back pain with numbness in lower RIGHT leg

EXAM:
LUMBAR SPINE - COMPLETE 4+ VIEW

[lumbar spine ap]
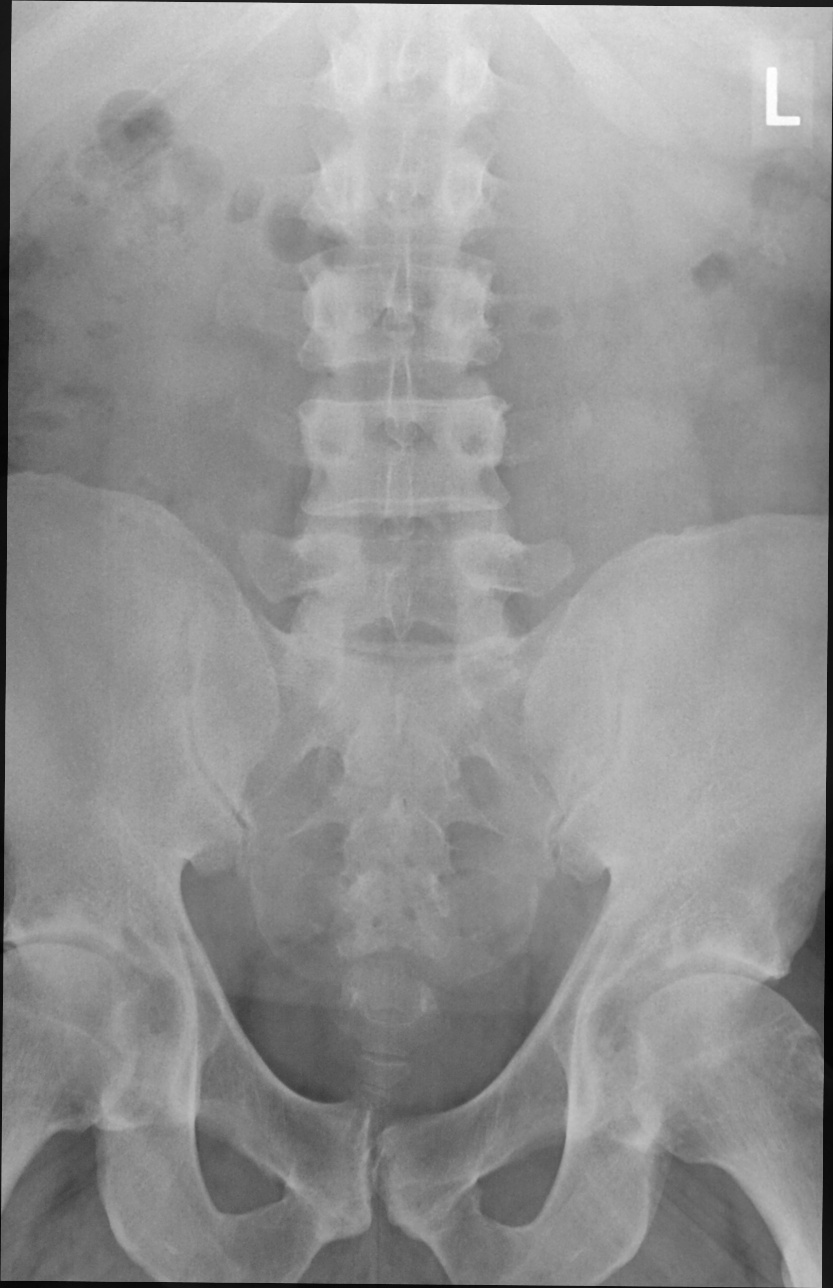

[lumbar spine obl (oblique) (1 of 2)]
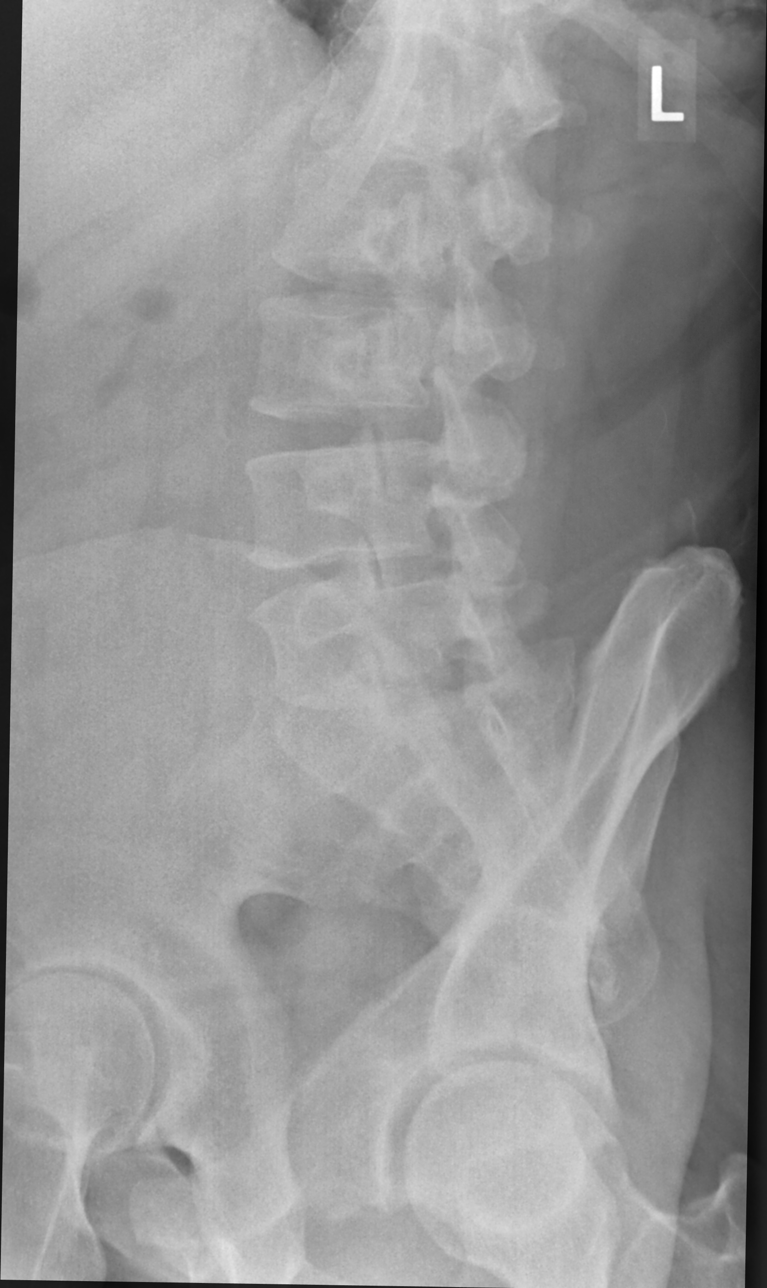

[lumbar spine obl (oblique) (2 of 2)]
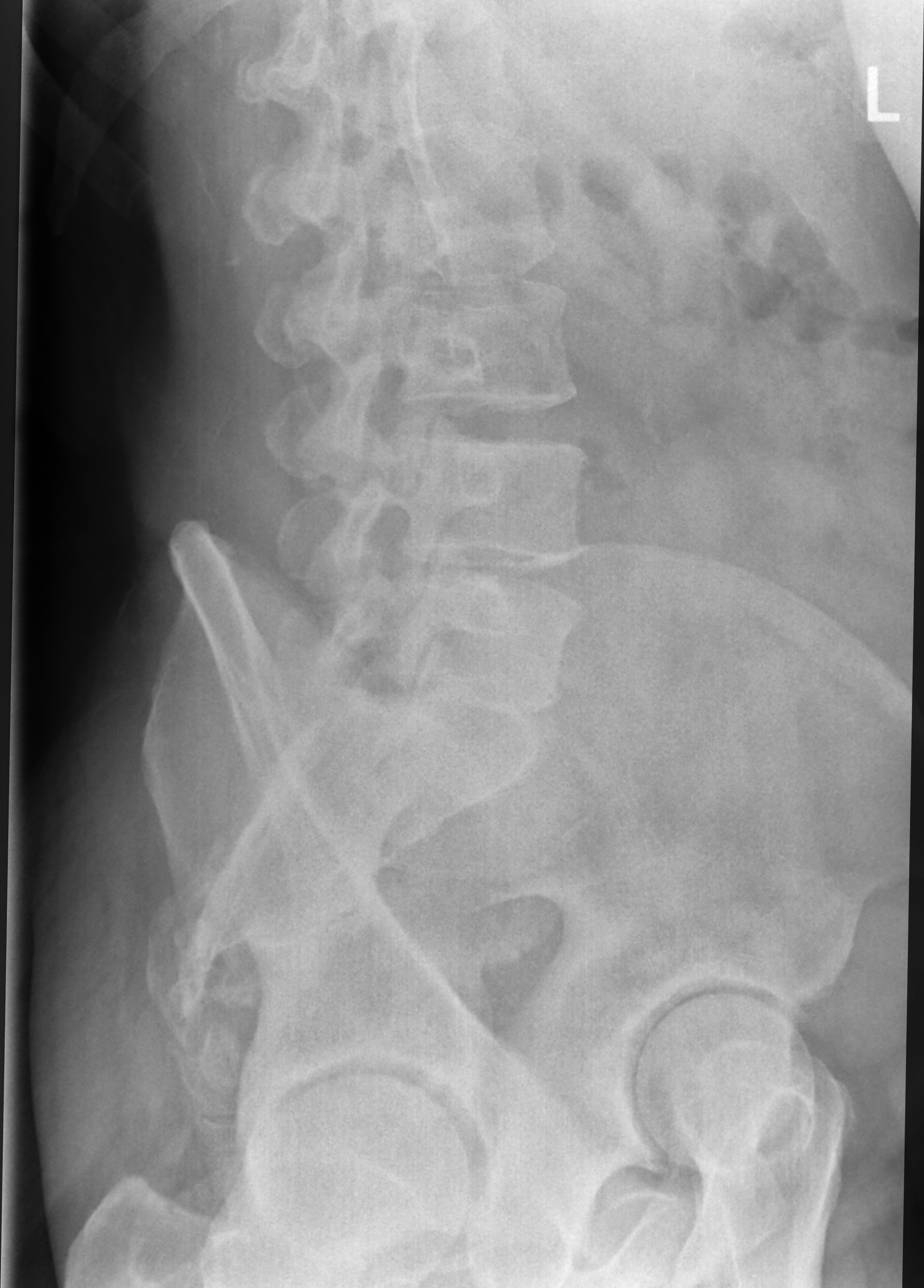

[lumbar spine lat]
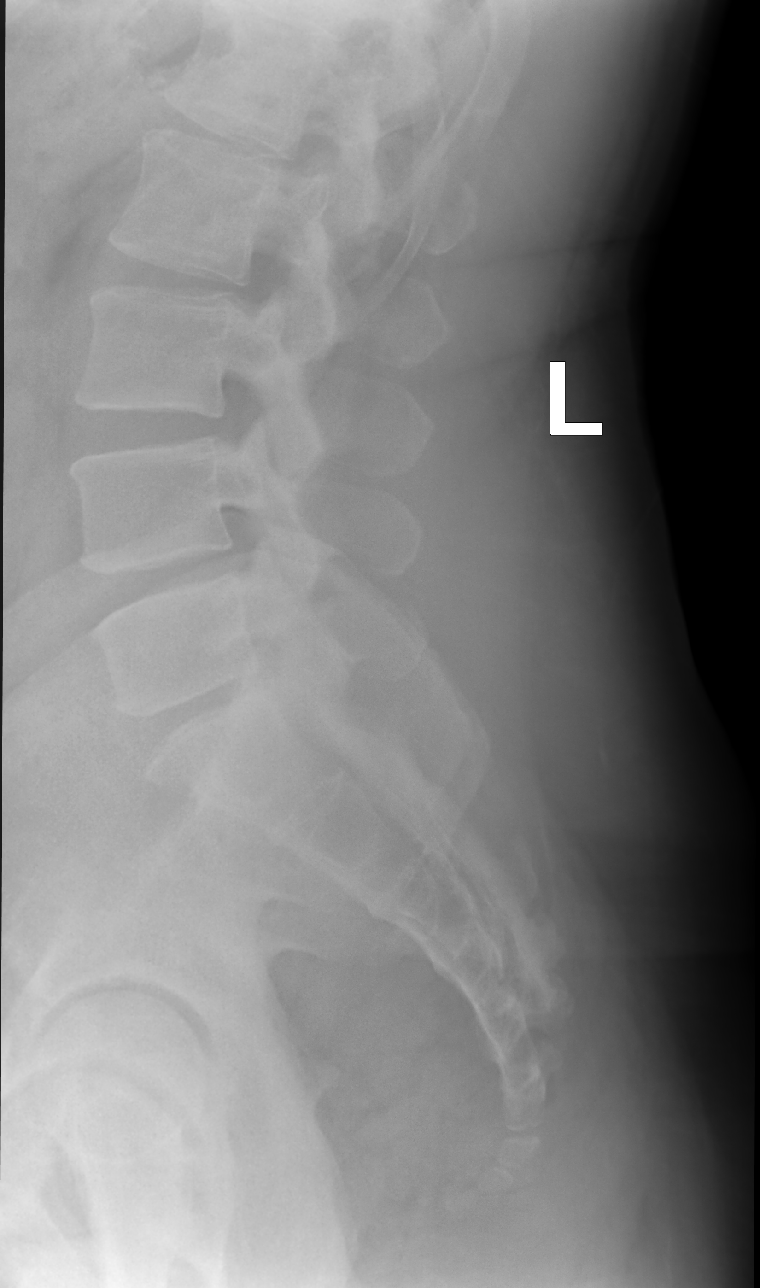

[lumbar spot lat]
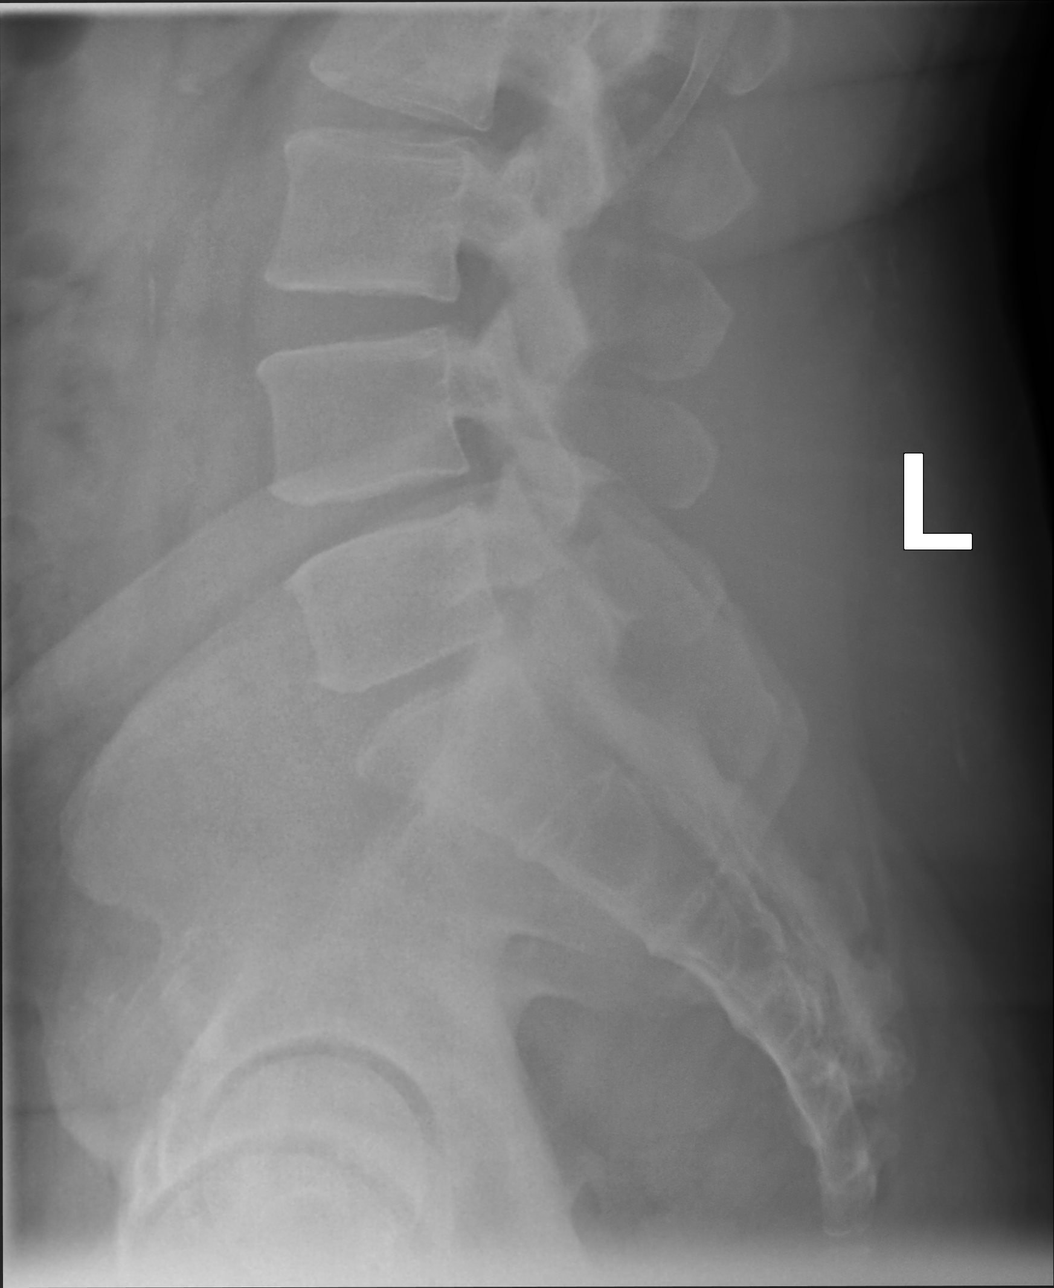

[5 of 5 positions shown; findings below may reference images not displayed]

FINDINGS: Five non-rib-bearing lumbar vertebra.

Osseous mineralization normal.

Vertebral body and disc space heights maintained.

No acute fracture, subluxation, bone destruction or spondylolysis.

SI joints symmetric.

Mild degenerative changes of the hip joints bilaterally, greater on
RIGHT.
IMPRESSION: No acute lumbar spine abnormalities.

Degenerative changes hip joints, greater on RIGHT.

## 2019-05-29 ENCOUNTER — Telehealth: Payer: Self-pay | Admitting: *Deleted

## 2019-05-29 ENCOUNTER — Telehealth: Payer: Self-pay | Admitting: General Practice

## 2019-05-29 DIAGNOSIS — Z20822 Contact with and (suspected) exposure to covid-19: Secondary | ICD-10-CM

## 2019-05-29 NOTE — Addendum Note (Signed)
Addended by: Dimple Nanas on: 05/29/2019 05:17 PM   Modules accepted: Orders

## 2019-05-29 NOTE — Telephone Encounter (Signed)
Pt has been scheduled for covid testing. Scheduled with pt directly. Pt has been referred by: McLean-Scocuzza, Nino Glow, MD

## 2019-05-29 NOTE — Telephone Encounter (Signed)
Copied from [place] [id number]. Topic: General - Other >> May 29, 2019  1:56 PM Ivar Drape wrote: Reason for CRM:  Patient would like to be tested for the [facility] Virus.  He stated he has been in close contact with someone at work that was diagnosed positively having it.

## 2019-05-29 NOTE — Telephone Encounter (Signed)
Ordered COVID 19 testing  They will call pt he needs to wear a mask covering nose and mouth  And not go to work until test results back also household contacts have to quarantine if + stay home x 14 days   Wear a mask at all times until COVID 19 cases go down   [employer]

## 2019-05-29 NOTE — Telephone Encounter (Signed)
Patient said he would stay home and quarantine as directed and house hold members. Patient understanding to testing and to he would receive call with test time and place.

## 2019-05-29 NOTE — Telephone Encounter (Signed)
-----   Message from Delorise Jackson, MD sent at 05/29/2019  4:07 PM EDT ----- Primary Coverage  Payer Plan Sponsor Code Group Number Group Name [place]  [id number] [facility] Primary Subscriber  ID Name [facility] Address [id number] [id number]    [place], [place] [address]  1976/07/24   COVID 19 exposure ordered testing   [place]

## 2019-05-30 ENCOUNTER — Other Ambulatory Visit: Payer: Managed Care, Other (non HMO)

## 2019-05-30 DIAGNOSIS — Z20822 Contact with and (suspected) exposure to covid-19: Secondary | ICD-10-CM

## 2019-06-03 LAB — NOVEL CORONAVIRUS, NAA: SARS-CoV-2, NAA: NOT DETECTED

## 2019-06-17 ENCOUNTER — Other Ambulatory Visit: Payer: Self-pay

## 2019-06-17 ENCOUNTER — Ambulatory Visit (INDEPENDENT_AMBULATORY_CARE_PROVIDER_SITE_OTHER): Payer: Managed Care, Other (non HMO) | Admitting: Internal Medicine

## 2019-06-17 DIAGNOSIS — M160 Bilateral primary osteoarthritis of hip: Secondary | ICD-10-CM | POA: Diagnosis not present

## 2019-06-17 DIAGNOSIS — R634 Abnormal weight loss: Secondary | ICD-10-CM

## 2019-06-17 DIAGNOSIS — R109 Unspecified abdominal pain: Secondary | ICD-10-CM

## 2019-06-17 DIAGNOSIS — R197 Diarrhea, unspecified: Secondary | ICD-10-CM

## 2019-06-17 DIAGNOSIS — K921 Melena: Secondary | ICD-10-CM

## 2019-06-17 DIAGNOSIS — R002 Palpitations: Secondary | ICD-10-CM

## 2019-06-17 DIAGNOSIS — E559 Vitamin D deficiency, unspecified: Secondary | ICD-10-CM | POA: Diagnosis not present

## 2019-06-17 DIAGNOSIS — R7303 Prediabetes: Secondary | ICD-10-CM

## 2019-06-17 HISTORY — DX: Melena: K92.1

## 2019-06-17 MED ORDER — DICLOFENAC SODIUM 1 % TD GEL: 4.0000 g | Freq: Four times a day (QID) | TRANSDERMAL | 11 refills | Status: AC

## 2019-06-17 MED ORDER — VITAMIN D3 125 MCG (5000 UT) PO TABS: 1.0000 | ORAL_TABLET | Freq: Every day | ORAL | 3 refills | Status: DC

## 2019-06-17 MED ORDER — OMEPRAZOLE 40 MG PO CPDR: 40.0000 mg | DELAYED_RELEASE_CAPSULE | Freq: Every day | ORAL | 1 refills | Status: AC

## 2019-06-17 NOTE — Progress Notes (Signed)
Virtual Visit via Video Note  I connected with Brian Reyes   on 06/17/19 at  9:30 AM EDT by a video enabled telemedicine application and verified that I am speaking with the correct person using two identifiers.  Location patient: home Location provider:work  Persons participating in the virtual visit: patient, provider  I discussed the limitations of evaluation and management by telemedicine and the availability of in person appointments. The patient expressed understanding and agreed to proceed.   HPI: 1. X 2 months c/o abdominal cramping, diarrhea loss of 20 lbs and blood in stool he does take ibuprofen for joint pain He is c/w IBS 2. B/l hip pain Xray 09/18/18 with degenerative changes  3. Palpitations will refer to cardiology he never f/u  4. Vitamin D def did not pick up D3 5000 IU daily     ROS: See pertinent positives and negatives per HPI.  Past Medical History:  Diagnosis Date  . Arthritis   . Hip pain, acute, right   . HSV-2 (herpes simplex virus 2) infection   . Plantar fasciitis   . Vitamin D deficiency     Past Surgical History:  Procedure Laterality Date  . NO PAST SURGERIES      Family History  Problem Relation Age of Onset  . Heart disease Mother        age 49   . Hypertension Mother   . Diabetes Sister        DM 2     SOCIAL HX:  [employer]  4 kids 1 bio  Married  No guns  Wears seat belt  Safe in relationship  Former marine     Current Outpatient Medications:  .  Cholecalciferol (VITAMIN D3) 125 MCG (5000 UT) TABS, Take 1 tablet (5,000 Units total) by mouth daily., Disp: 90 tablet, Rfl: 3 .  diclofenac sodium (VOLTAREN) 1 % GEL, Apply 4 g topically 4 (four) times daily. B/l hips osteoarthritis, Disp: 100 g, Rfl: 11 .  ibuprofen (ADVIL,MOTRIN) 600 MG tablet, Take by mouth., Disp: , Rfl:   EXAM:  VITALS per patient if applicable:  GENERAL: alert, oriented, appears well and in no acute distress  HEENT: atraumatic, conjunttiva  clear, no obvious abnormalities on inspection of external nose and ears  NECK: normal movements of the head and neck  LUNGS: on inspection no signs of respiratory distress, breathing rate appears normal, no obvious gross SOB, gasping or wheezing  CV: no obvious cyanosis  MS: moves all visible extremities without noticeable abnormality  PSYCH/NEURO: pleasant and cooperative, no obvious depression or anxiety, speech and thought processing grossly intact  ASSESSMENT AND PLAN:  Discussed the following assessment and plan:  Blood in stool -? GI related ulcer due to NSAID use start ppi qd  Abdominal cramping -  Weight loss -  -referred to Dr. Jonathon Bellows consider colonoscopy/EGD  Osteoarthritis of both hips, unspecified osteoarthritis type - Plan: diclofenac sodium (VOLTAREN) 1 % GEL Referred to Dr. Alvan Dame  Vitamin D deficiency - Plan: Cholecalciferol (VITAMIN D3) 125 MCG (5000 UT) TABS qd  Palpitations and FH heart disease refer to Dr. Daneen Schick  Prediabetes - Plan: rec healthy diet and exercise   HM  Declines flu shot  Tdap had 08/01/17  Vit D def on vitamin d  Prediabetes  HSV 2 wife has and he knows about It prn Valtrex rec smoking cessation <1 ppd x 30 years no FH lung  I discussed the assessment and treatment plan with the patient. The  patient was provided an opportunity to ask questions and all were answered. The patient agreed with the plan and demonstrated an understanding of the instructions.   The patient was advised to call back or seek an in-person evaluation if the symptoms worsen or if the condition fails to improve as anticipated.  Time spent 25 minutes  Delorise Jackson, MD

## 2019-06-23 ENCOUNTER — Ambulatory Visit (INDEPENDENT_AMBULATORY_CARE_PROVIDER_SITE_OTHER): Payer: Managed Care, Other (non HMO) | Admitting: Gastroenterology

## 2019-06-23 ENCOUNTER — Other Ambulatory Visit: Payer: Self-pay

## 2019-06-23 DIAGNOSIS — R197 Diarrhea, unspecified: Secondary | ICD-10-CM

## 2019-06-23 DIAGNOSIS — K921 Melena: Secondary | ICD-10-CM | POA: Diagnosis not present

## 2019-06-23 NOTE — Progress Notes (Signed)
Brian Reyes [address]  [place]  [place], [place] [address]  Main: [phone]  Fax: [phone]   Gastroenterology Consultation  Referring Provider:     McLean-Scocuzza, Olivia Mackie * Primary Care Physician:  McLean-Scocuzza, Nino Glow, MD Reason for Consultation:     Blood in stool        HPI:   Virtual Visit via Video Note  I connected with patient on 06/23/19 at 10:30 AM EDT by video ([email or web address]) and verified that I am speaking with the correct person using two identifiers.   I discussed the limitations, risks, security and privacy concerns of performing an evaluation and management service by video and the availability of in person appointments. I also discussed with the patient that there may be a patient responsible charge related to this service. The patient expressed understanding and agreed to proceed.  Location of the patient: Home Location of provider: Home Participating persons: Patient and provider only (Nursing staff checked in patient via phone but were not physically involved in the video interaction - see their notes)   History of Present Illness: Chief Complaint  Patient presents with  . New Patient (Initial Visit)  . Blood In Stools  . Abdominal Pain  . Diarrhea    Brian Reyes is a 43 y.o. y/o male referred for consultation & management  by Dr. Terese Door, Nino Glow, MD.  Patient reports 3 to 45-month history of altered bowel habits with loose stools about 2-3 times a day.  Also associated with blood streaks in stool, occurring about once or twice a week.  No previous history of similar symptoms.  Also reports abdominal cramping associated with the bowel movements.  The cramping goes away after he has a bowel movement.  Subjective weight loss as well.  States about 4 months ago had some nausea vomiting which self resolved with the loose stools continued.  No family history of colon cancer.  No prior EGD or colonoscopy. Son reports daily ibuprofen  use but 2-3 times a day.  Voltaren gel, he has only used once.  Past Medical History:  Diagnosis Date  . Arthritis   . Hip pain, acute, right   . HSV-2 (herpes simplex virus 2) infection   . Plantar fasciitis   . Vitamin D deficiency     Past Surgical History:  Procedure Laterality Date  . NO PAST SURGERIES      Prior to Admission medications   Medication Sig Start Date End Date Taking? Authorizing Provider  Cholecalciferol (VITAMIN D3) 125 MCG (5000 UT) TABS Take 1 tablet (5,000 Units total) by mouth daily. 06/17/19  Yes McLean-Scocuzza, Nino Glow, MD  diclofenac sodium (VOLTAREN) 1 % GEL Apply 4 g topically 4 (four) times daily. B/l hips osteoarthritis 06/17/19  Yes McLean-Scocuzza, Nino Glow, MD  ibuprofen (ADVIL,MOTRIN) 600 MG tablet Take by mouth.   Yes [provider]  omeprazole (PRILOSEC) 40 MG capsule Take 1 capsule (40 mg total) by mouth daily. 30 minutes before food 06/17/19  Yes McLean-Scocuzza, Nino Glow, MD    Family History  Problem Relation Age of Onset  . Heart disease Mother        age 74   . Hypertension Mother   . Diabetes Sister        DM 2      Social History   Tobacco Use  . Smoking status: Current Some Day Smoker    Packs/day: 0.50    Types: Cigarettes  . Smokeless tobacco: Never Used  Substance  Use Topics  . Alcohol use: Yes    Alcohol/week: 14.0 standard drinks    Types: 14 Cans of beer per week  . Drug use: No    Allergies as of 06/23/2019  . (No Known Allergies)    Review of Systems:    All systems reviewed and negative except where noted in HPI.   Observations/Objective:  Labs: CBC    Component Value Date/Time   WBC 6.4 10/01/2018 0854   WBC 6.9 04/25/2015 1216   RBC 5.62 10/01/2018 0854   RBC 5.32 04/25/2015 1216   HGB 14.0 10/01/2018 0854   HCT 44.3 10/01/2018 0854   PLT 197 10/01/2018 0854   MCV 79 10/01/2018 0854   MCV 79 (L) 04/15/2014 0436   MCH 24.9 (L) 10/01/2018 0854   MCH 24.8 (L) 04/25/2015 1216   MCHC 31.6  10/01/2018 0854   MCHC 31.5 (L) 04/25/2015 1216   RDW 14.3 10/01/2018 0854   RDW 14.5 04/15/2014 0436   LYMPHSABS 2.8 10/01/2018 0854   LYMPHSABS 2.5 04/15/2014 0436   MONOABS 1.0 04/25/2015 1216   MONOABS 0.9 04/15/2014 0436   EOSABS 0.1 10/01/2018 0854   EOSABS 0.0 04/15/2014 0436   BASOSABS 0.0 10/01/2018 0854   BASOSABS 0.0 04/15/2014 0436   CMP     Component Value Date/Time   NA 144 10/01/2018 0854   NA 138 04/15/2014 0436   K 4.0 10/01/2018 0854   K 3.7 04/15/2014 0436   CL 106 10/01/2018 0854   CL 108 (H) 04/15/2014 0436   CO2 24 10/01/2018 0854   CO2 27 04/15/2014 0436   GLUCOSE 97 10/01/2018 0854   GLUCOSE 98 04/25/2015 1216   GLUCOSE 97 04/15/2014 0436   BUN 18 10/01/2018 0854   BUN 8 04/15/2014 0436   CREATININE 0.99 10/01/2018 0854   CREATININE 1.08 04/15/2014 0436   CALCIUM 9.5 10/01/2018 0854   CALCIUM 8.1 (L) 04/15/2014 0436   PROT 6.7 10/01/2018 0854   PROT 6.9 04/14/2014 0949   ALBUMIN 4.3 10/01/2018 0854   ALBUMIN 3.7 04/14/2014 0949   AST 14 10/01/2018 0854   AST 20 04/14/2014 0949   ALT 16 10/01/2018 0854   ALT 21 04/14/2014 0949   ALKPHOS 77 10/01/2018 0854   ALKPHOS 61 04/14/2014 0949   BILITOT <0.2 10/01/2018 0854   BILITOT 0.2 04/14/2014 0949   GFRNONAA 94 10/01/2018 0854   GFRNONAA >60 04/15/2014 0436   GFRAA 108 10/01/2018 0854   GFRAA >60 04/15/2014 0436    Imaging Studies: No results found.  Assessment and Plan:   Brian Reyes is a 43 y.o. y/o male has been referred for blood in stool  Assessment and Plan: Given altered bowel habits, with loose stools starting about 3 to 4 months ago, with associated blood in stool, I discussed with the patient that he will need further evaluation with EGD and colonoscopy to rule out ulcers and IBD given his ongoing NSAID use.  Patient would like to speak to his wife before scheduling his procedures.  I have asked him to call us back as soon as possible to start his work-up.  In the meantime,  he is agreeable to obtaining blood work and stool tests for inflammatory markers and ruling out infectious process  I have advised him to avoid NSAID use such as Ibuprofen, Aleeve, advil, motrin, BC and Goodie powder, Naproxen, Meloxicam and others.   Patient was placed on PPI by primary care provider due to his NSAID use.  He states this has  not helped with his abdominal pain but he continues to have diarrhea.  Avoiding NSAIDs and using PPI to treat any underlying ulcers will help. (Risks of PPI use were discussed with patient including bone loss, C. Diff diarrhea, pneumonia, infections, CKD, electrolyte abnormalities.  If clinically possible based on symptoms, goal would be to maintain patient on the lowest dose possible, or discontinue the medication with institution of acid reflux lifestyle modifications over time. Pt. Verbalizes understanding and chooses to continue the medication.)  I do not suspect an upper GI bleed at this time given that he is not having any melena and last episode of bright red blood was about 1.5 weeks ago.  Will check for anemia any lab work  I have discussed alternative options, risks & benefits,  which include, but are not limited to, bleeding, infection, perforation,respiratory complication & drug reaction.  The patient agrees with this plan & written consent will be obtained.     Follow Up Instructions: Follow-up in 4 to 6 weeks  I discussed the assessment and treatment plan with the patient. The patient was provided an opportunity to ask questions and all were answered. The patient agreed with the plan and demonstrated an understanding of the instructions.   The patient was advised to call back or seek an in-person evaluation if the symptoms worsen or if the condition fails to improve as anticipated.  I provided 30 minutes of face-to-face time via video software during this encounter.  Additional time was spent in reviewing patient's chart, placing orders etc.    Virgel Manifold, MD  Speech recognition software was used to dictate the above note.

## 2019-08-13 ENCOUNTER — Ambulatory Visit: Payer: Managed Care, Other (non HMO) | Admitting: Gastroenterology

## 2019-08-13 ENCOUNTER — Other Ambulatory Visit: Payer: Self-pay

## 2019-08-13 ENCOUNTER — Encounter: Payer: Self-pay | Admitting: Gastroenterology

## 2019-08-13 VITALS — BP 155/91 | HR 65 | Temp 98.9°F | Wt 240.0 lb

## 2019-08-13 DIAGNOSIS — K625 Hemorrhage of anus and rectum: Secondary | ICD-10-CM | POA: Diagnosis not present

## 2019-08-13 NOTE — Progress Notes (Signed)
Vonda Antigua, MD [address]  [place]  [place], [place] [address]  Main: [phone]  Fax: [phone]   Primary Care Physician: McLean-Scocuzza, Nino Glow, MD   Chief Complaint  Patient presents with  . Blood In Stools    Has nnot noticed in 1 month     HPI: Brian Reyes is a 43 y.o. male here for follow-up of rectal bleeding.  Symptoms have not reoccurred in 1 month.  Reports one soft bowel movement daily.  No weight loss.  No abdominal pain.  No nausea or vomiting.  No melena.  No family history of colon cancer.  Previously colonoscopy was discussed due to intermittent rectal bleeding, but patient stated he would like to discuss with his wife before scheduling.  He still is staying the same thing and is not ready to schedule at this time.  Labs were ordered on previous visit and he has not had that done.  Current Outpatient Medications  Medication Sig Dispense Refill  . Cholecalciferol (VITAMIN D3) 125 MCG (5000 UT) TABS Take 1 tablet (5,000 Units total) by mouth daily. (Patient not taking: Reported on 08/13/2019) 90 tablet 3  . diclofenac sodium (VOLTAREN) 1 % GEL Apply 4 g topically 4 (four) times daily. B/l hips osteoarthritis (Patient not taking: Reported on 08/13/2019) 100 g 11  . ibuprofen (ADVIL,MOTRIN) 600 MG tablet Take by mouth.    Marland Kitchen omeprazole (PRILOSEC) 40 MG capsule Take 1 capsule (40 mg total) by mouth daily. 30 minutes before food (Patient not taking: Reported on 08/13/2019) 30 capsule 1   No current facility-administered medications for this visit.     Allergies as of 08/13/2019  . (No Known Allergies)    ROS:  General: Negative for anorexia, weight loss, fever, chills, fatigue, weakness. ENT: Negative for hoarseness, difficulty swallowing , nasal congestion. CV: Negative for chest pain, angina, palpitations, dyspnea on exertion, peripheral edema.  Respiratory: Negative for dyspnea at rest, dyspnea on exertion, cough, sputum, wheezing.  GI: See  history of present illness. GU:  Negative for dysuria, hematuria, urinary incontinence, urinary frequency, nocturnal urination.  Endo: Negative for unusual weight change.    Physical Examination:   BP (!) 155/91 (BP Location: Left Arm, Patient Position: Sitting, Cuff Size: Normal)   Pulse 65   Temp 98.9 F (37.2 C) (Oral)   Wt 240 lb (108.9 kg)   BMI 32.55 kg/m   General: Well-nourished, well-developed in no acute distress.  Eyes: No icterus. Conjunctivae pink. Mouth: Oropharyngeal mucosa moist and pink , no lesions erythema or exudate. Neck: Supple, Trachea midline Abdomen: Bowel sounds are normal, nontender, nondistended, no hepatosplenomegaly or masses, no abdominal bruits or hernia , no rebound or guarding.   Extremities: No lower extremity edema. No clubbing or deformities. Neuro: Alert and oriented x 3.  Grossly intact. Skin: Warm and dry, no jaundice.   Psych: Alert and cooperative, normal mood and affect.   Labs: CMP     Component Value Date/Time   NA 144 10/01/2018 0854   NA 138 04/15/2014 0436   K 4.0 10/01/2018 0854   K 3.7 04/15/2014 0436   CL 106 10/01/2018 0854   CL 108 (H) 04/15/2014 0436   CO2 24 10/01/2018 0854   CO2 27 04/15/2014 0436   GLUCOSE 97 10/01/2018 0854   GLUCOSE 98 04/25/2015 1216   GLUCOSE 97 04/15/2014 0436   BUN 18 10/01/2018 0854   BUN 8 04/15/2014 0436   CREATININE 0.99 10/01/2018 0854   CREATININE 1.08 04/15/2014  0436   CALCIUM 9.5 10/01/2018 0854   CALCIUM 8.1 (L) 04/15/2014 0436   PROT 6.7 10/01/2018 0854   PROT 6.9 04/14/2014 0949   ALBUMIN 4.3 10/01/2018 0854   ALBUMIN 3.7 04/14/2014 0949   AST 14 10/01/2018 0854   AST 20 04/14/2014 0949   ALT 16 10/01/2018 0854   ALT 21 04/14/2014 0949   ALKPHOS 77 10/01/2018 0854   ALKPHOS 61 04/14/2014 0949   BILITOT <0.2 10/01/2018 0854   BILITOT 0.2 04/14/2014 0949   GFRNONAA 94 10/01/2018 0854   GFRNONAA >60 04/15/2014 0436   GFRAA 108 10/01/2018 0854   GFRAA >60 04/15/2014 0436    Lab Results  Component Value Date   WBC 6.4 10/01/2018   HGB 14.0 10/01/2018   HCT 44.3 10/01/2018   MCV 79 10/01/2018   PLT 197 10/01/2018    Imaging Studies: No results found.  Assessment and Plan:   Brian Reyes is a 44 y.o. y/o male here for follow-up of intermittent rectal bleeding  Symptoms have not reoccurred in 1 month However, patient does not want to schedule colonoscopy at this time and states he will call us back.  We offered to schedule a colonoscopy has been scheduled, but he does not want to schedule at this time.  He does not want to schedule a follow-up clinic visit.  States he will call us once he is ready to schedule.  However, he does state he wants to schedule at some point when he is ready, just not today.  Risk of underlying polyps or malignancy discussed in detail and he verbalized understanding and states will call us back when he is ready to schedule All questions answered to his satisfaction  He is willing to get labs drawn that was ordered previously    Dr Vonda Antigua

## 2019-08-14 LAB — COMPREHENSIVE METABOLIC PANEL
ALT: 16 IU/L (ref 0–44)
AST: 18 IU/L (ref 0–40)
Albumin/Globulin Ratio: 1.8 (ref 1.2–2.2)
Albumin: 4.3 g/dL (ref 4.0–5.0)
Alkaline Phosphatase: 67 IU/L (ref 39–117)
BUN/Creatinine Ratio: 13 (ref 9–20)
BUN: 11 mg/dL (ref 6–24)
Bilirubin Total: <0.2 mg/dL (ref 0.0–1.2)
CO2: 22 mmol/L (ref 20–29)
Calcium: 8.8 mg/dL (ref 8.7–10.2)
Chloride: 105 mmol/L (ref 96–106)
Creatinine, Ser: 0.86 mg/dL (ref 0.76–1.27)
GFR calc Af Amer: 123 mL/min/{1.73_m2} (ref >59)
GFR calc non Af Amer: 106 mL/min/{1.73_m2} (ref >59)
Globulin, Total: 2.4 g/dL (ref 1.5–4.5)
Glucose: 89 mg/dL (ref 65–99)
Potassium: 4.4 mmol/L (ref 3.5–5.2)
Sodium: 138 mmol/L (ref 134–144)
Total Protein: 6.7 g/dL (ref 6.0–8.5)

## 2019-08-14 LAB — CBC
Hematocrit: 43.2 % (ref 37.5–51.0)
Hemoglobin: 13.5 g/dL (ref 13.0–17.7)
MCH: 25 pg — ABNORMAL LOW (ref 26.6–33.0)
MCHC: 31.3 g/dL — ABNORMAL LOW (ref 31.5–35.7)
MCV: 80 fL (ref 79–97)
Platelets: 168 10*3/uL (ref 150–450)
RBC: 5.4 x10E6/uL (ref 4.14–5.80)
RDW: 15.4 % (ref 11.6–15.4)
WBC: 6.5 10*3/uL (ref 3.4–10.8)

## 2019-08-14 LAB — SEDIMENTATION RATE: Sed Rate: 29 mm/hr — ABNORMAL HIGH (ref 0–15)

## 2019-08-14 LAB — C-REACTIVE PROTEIN: CRP: 2 mg/L (ref 0–10)

## 2019-08-25 ENCOUNTER — Telehealth: Payer: Self-pay

## 2019-08-25 NOTE — Telephone Encounter (Signed)
Called and left a message for call back  

## 2019-08-25 NOTE — Telephone Encounter (Signed)
-----   Message from Virgel Manifold, MD sent at 08/25/2019  4:43 PM EDT ----- Caryl Pina please let the patient know, his labwork showed elevation in one of the inflammatory markers. We had discussed a colonoscopy with him and he wanted to wait to schedule. With the blood in his stool and elevation in his inflammatory marker, I recommend a colonoscopy if he is agreeable. I also recommend a referral to hematology for further workup due to low normal MCV (the size of the red blood cells). If he is agreeable, please refer to Dr. Tasia Catchings for microcytosis. We are awaiting his stool tests still.

## 2019-08-26 NOTE — Telephone Encounter (Signed)
Called and left a message for call back  

## 2019-08-27 NOTE — Telephone Encounter (Signed)
Mailed letter to patient

## 2019-08-27 NOTE — Telephone Encounter (Signed)
Called and left a message for call back to go over lab results. Left a message on emergency contact number also for call back.  Can I send a letter

## 2019-09-16 NOTE — Progress Notes (Deleted)
Cardiology Office Note:    Date:  09/16/2019   ID:  Brian Reyes, DOB Jul 17, 1976, MRN [id number]  PCP:  McLean-Scocuzza, Nino Glow, MD  Cardiologist:  No primary care provider on file.   Referring MD: McLean-Scocuzza, Olivia Mackie *   No chief complaint on file.   History of Present Illness:    Brian Reyes is a 43 y.o. male with a hx of palpitations referred by Orland Mustard MD, for evaluation of palpitations.  ***  Past Medical History:  Diagnosis Date  . Acute right-sided low back pain with right-sided sciatica 09/18/2018  . Arthritis   . Blood in stool 06/17/2019  . Heart palpitations 09/18/2018  . Herpes simplex type 2 infection 12/19/2018  . Hip pain, acute, right   . HSV-2 (herpes simplex virus 2) infection   . Obesity (BMI 30-39.9) 12/19/2018  . Plantar fasciitis   . Prediabetes 12/19/2018  . Right hip pain 09/18/2018  . Vitamin D deficiency     Past Surgical History:  Procedure Laterality Date  . NO PAST SURGERIES      Current Medications: No outpatient medications have been marked as taking for the 09/17/19 encounter (Appointment) with Belva Crome, MD.     Allergies:   Patient has no known allergies.   Social History   Socioeconomic History  . Marital status: Single    Spouse name: Not on file  . Number of children: Not on file  . Years of education: Not on file  . Highest education level: Not on file  Occupational History  . Not on file  Social Needs  . Financial resource strain: Not on file  . Food insecurity    Worry: Not on file    Inability: Not on file  . Transportation needs    Medical: Not on file    Non-medical: Not on file  Tobacco Use  . Smoking status: Current Some Day Smoker    Packs/day: 0.50    Types: Cigarettes  . Smokeless tobacco: Never Used  Substance and Sexual Activity  . Alcohol use: Yes    Alcohol/week: 14.0 standard drinks    Types: 14 Cans of beer per week  . Drug use: No  . Sexual activity: Yes  Lifestyle  . Physical  activity    Days per week: Not on file    Minutes per session: Not on file  . Stress: Not on file  Relationships  . Social Herbalist on phone: Not on file    Gets together: Not on file    Attends religious service: Not on file    Active member of club or organization: Not on file    Attends meetings of clubs or organizations: Not on file    Relationship status: Not on file  Other Topics Concern  . Not on file  Social History Narrative   [employer]    4 kids 1 bio    Married    No guns    Wears seat belt    Safe in relationship    Former marine      Family History: The patient's family history includes Diabetes in his sister; Heart disease in his mother; Hypertension in his mother.  ROS:   Please see the history of present illness.    *** All other systems reviewed and are negative.  EKGs/Labs/Other Studies Reviewed:    The following studies were reviewed today: ***  EKG:  EKG ***  Recent Labs: 10/01/2018: TSH 1.080 08/13/2019:  ALT 16; BUN 11; Creatinine, Ser 0.86; Hemoglobin 13.5; Platelets 168; Potassium 4.4; Sodium 138  Recent Lipid Panel    Component Value Date/Time   CHOL 152 10/01/2018 0854   TRIG 104 10/01/2018 0854   HDL 47 10/01/2018 0854   CHOLHDL 3.2 10/01/2018 0854   LDLCALC 84 10/01/2018 0854    Physical Exam:    VS:  There were no vitals taken for this visit.    Wt Readings from Last 3 Encounters:  08/13/19 240 lb (108.9 kg)  12/19/18 252 lb 6.4 oz (114.5 kg)  11/05/18 251 lb (113.9 kg)     GEN: ***. No acute distress HEENT: Normal NECK: No JVD. LYMPHATICS: No lymphadenopathy CARDIAC: *** RRR without murmur, gallop, or edema. VASCULAR: *** Normal Pulses. No bruits. RESPIRATORY:  Clear to auscultation without rales, wheezing or rhonchi  ABDOMEN: Soft, non-tender, non-distended, No pulsatile mass, MUSCULOSKELETAL: No deformity  SKIN: Warm and dry NEUROLOGIC:  Alert and oriented x 3 PSYCHIATRIC:  Normal affect    ASSESSMENT:    1. Heart palpitations   2. Prediabetes   3. Obesity (BMI 30-39.9)   4. Educated about COVID-19 virus infection    PLAN:    In order of problems listed above:  1. ***   Medication Adjustments/Labs and Tests Ordered: Current medicines are reviewed at length with the patient today.  Concerns regarding medicines are outlined above.  No orders of the defined types were placed in this encounter.  No orders of the defined types were placed in this encounter.   There are no Patient Instructions on file for this visit.   Signed, Sinclair Grooms, MD  09/16/2019 7:07 PM    [place]

## 2019-09-17 ENCOUNTER — Ambulatory Visit: Payer: Managed Care, Other (non HMO) | Admitting: Interventional Cardiology

## 2019-09-17 ENCOUNTER — Ambulatory Visit: Payer: Managed Care, Other (non HMO) | Admitting: Internal Medicine

## 2019-09-17 DIAGNOSIS — Z0289 Encounter for other administrative examinations: Secondary | ICD-10-CM

## 2021-05-24 ENCOUNTER — Ambulatory Visit (INDEPENDENT_AMBULATORY_CARE_PROVIDER_SITE_OTHER): Payer: Managed Care, Other (non HMO) | Admitting: Internal Medicine

## 2021-05-24 ENCOUNTER — Encounter: Payer: Self-pay | Admitting: Internal Medicine

## 2021-05-24 ENCOUNTER — Other Ambulatory Visit: Payer: Self-pay

## 2021-05-24 VITALS — BP 136/82 | HR 63 | Temp 97.8°F | Ht 72.0 in | Wt 242.0 lb

## 2021-05-24 DIAGNOSIS — Z0000 Encounter for general adult medical examination without abnormal findings: Secondary | ICD-10-CM

## 2021-05-24 DIAGNOSIS — H547 Unspecified visual loss: Secondary | ICD-10-CM

## 2021-05-24 DIAGNOSIS — Z1389 Encounter for screening for other disorder: Secondary | ICD-10-CM

## 2021-05-24 DIAGNOSIS — Z20822 Contact with and (suspected) exposure to covid-19: Secondary | ICD-10-CM

## 2021-05-24 DIAGNOSIS — Z1329 Encounter for screening for other suspected endocrine disorder: Secondary | ICD-10-CM | POA: Diagnosis not present

## 2021-05-24 DIAGNOSIS — R030 Elevated blood-pressure reading, without diagnosis of hypertension: Secondary | ICD-10-CM

## 2021-05-24 DIAGNOSIS — R739 Hyperglycemia, unspecified: Secondary | ICD-10-CM | POA: Diagnosis not present

## 2021-05-24 DIAGNOSIS — E559 Vitamin D deficiency, unspecified: Secondary | ICD-10-CM

## 2021-05-24 NOTE — Progress Notes (Signed)
Pre visit review using our clinic review tool, if applicable. No additional management support is needed unless otherwise documented below in the visit note. 

## 2021-05-24 NOTE — Patient Instructions (Addendum)
Monitor blood pressure at home goal <130/<80 Let me know if elevated blood pressure will start norvasc 2.5 mg daily    Let me know when ready for colonoscopy   [facility]  [place]  [place]  Colonoscopy, Adult A colonoscopy is a procedure to look at the entire large intestine. This procedure is done using a long, thin, flexible tube that has a camera on theend. You may have a colonoscopy: As a part of normal colorectal screening. If you have certain symptoms, such as: A low number of red blood cells in your blood (anemia). Diarrhea that does not go away. Pain in your abdomen. Blood in your stool. A colonoscopy can help screen for and diagnose medical problems, including: Tumors. Extra tissue that grows where mucus forms (polyps). Inflammation. Areas of bleeding. Tell your health care provider about: Any allergies you have. All medicines you are taking, including vitamins, herbs, eye drops, creams, and over-the-counter medicines. Any problems you or family members have had with anesthetic medicines. Any blood disorders you have. Any surgeries you have had. Any medical conditions you have. Any problems you have had with having bowel movements. Whether you are pregnant or may be pregnant. What are the risks? Generally, this is a safe procedure. However, problems may occur, including: Bleeding. Damage to your intestine. Allergic reactions to medicines given during the procedure. Infection. This is rare. What happens before the procedure? Eating and drinking restrictions Follow instructions from your health care provider about eating or drinking restrictions, which may include: A few days before the procedure: Follow a low-fiber diet. Avoid nuts, seeds, dried fruit, raw fruits, and vegetables. 1-3 days before the procedure: Eat only gelatin dessert or ice pops. Drink only clear liquids, such as water, clear juice, clear broth or bouillon,  black coffee or tea, or clear soft drinks or sports drinks. Avoid liquids that contain red or purple dye. The day of the procedure: Do not eat solid foods. You may continue to drink clear liquids until up to 2 hours before the procedure. Do not eat or drink anything starting 2 hours before the procedure, or within the time period that your health care provider recommends. Bowel prep If you were prescribed a bowel prep to take by mouth (orally) to clean out your colon: Take it as told by your health care provider. Starting the day before your procedure, you will need to drink a large amount of liquid medicine. The liquid will cause you to have many bowel movements of loose stool until your stool becomes almost clear or light green. If your skin or the opening between the buttocks (anus) gets irritated from diarrhea, you may relieve the irritation using: Wipes with medicine in them, such as adult wet wipes with aloe and vitamin E. A product to soothe skin, such as petroleum jelly. If you vomit while drinking the bowel prep: Take a break for up to 60 minutes. Begin the bowel prep again. Call your health care provider if you keep vomiting or you cannot take the bowel prep without vomiting. To clean out your colon, you may also be given: Laxative medicines. These help you have a bowel movement. Instructions for enema use. An enema is liquid medicine injected into your rectum. Medicines Ask your health care provider about: Changing or stopping your regular medicines or supplements. This is especially important if you are taking iron supplements, diabetes medicines, or blood thinners. Taking medicines such as aspirin and ibuprofen. These medicines can  thin your blood. Do not take these medicines unless your health care provider tells you to take them. Taking over-the-counter medicines, vitamins, herbs, and supplements. General instructions Ask your health care provider what steps will be taken to  help prevent infection. These may include washing skin with a germ-killing soap. Plan to have someone take you home from the hospital or clinic. What happens during the procedure?  An IV will be inserted into one of your veins. You may be given one or more of the following: A medicine to help you relax (sedative). A medicine to numb the area (local anesthetic). A medicine to make you fall asleep (general anesthetic). This is rarely needed. You will lie on your side with your knees bent. The tube will: Have oil or gel put on it (be lubricated). Be inserted into your anus. Be gently eased through all parts of your large intestine. Air will be sent into your colon to keep it open. This may cause some pressure or cramping. Images will be taken with the camera and will appear on a screen. A small tissue sample may be removed to be looked at under a microscope (biopsy). The tissue may be sent to a lab for testing if any signs of problems are found. If small polyps are found, they may be removed and checked for cancer cells. When the procedure is finished, the tube will be removed. The procedure may vary among health care providers and hospitals. What happens after the procedure? Your blood pressure, heart rate, breathing rate, and blood oxygen level will be monitored until you leave the hospital or clinic. You may have a small amount of blood in your stool. You may pass gas and have mild cramping or bloating in your abdomen. This is caused by the air that was used to open your colon during the exam. Do not drive for 24 hours after the procedure. It is up to you to get the results of your procedure. Ask your health care provider, or the department that is doing the procedure, when your results will be ready. Summary A colonoscopy is a procedure to look at the entire large intestine. Follow instructions from your health care provider about eating and drinking before the procedure. If you were  prescribed an oral bowel prep to clean out your colon, take it as told by your health care provider. During the colonoscopy, a flexible tube with a camera on its end is inserted into the anus and then passed into the other parts of the large intestine. This information is not intended to replace advice given to you by your health care provider. Make sure you discuss any questions you have with your healthcare provider. Document Revised: 06/20/2019 Document Reviewed: 06/20/2019 Elsevier Patient Education  2022 [place] Eating Plan DASH stands for Dietary Approaches to Stop Hypertension. The DASH eating plan is a healthy eating plan that has been shown to: Reduce high blood pressure (hypertension). Reduce your risk for type 2 diabetes, heart disease, and stroke. Help with weight loss. What are tips for following this plan? Reading food labels Check food labels for the amount of salt (sodium) per serving. Choose foods with less than 5 percent of the Daily Value of sodium. Generally, foods with less than 300 milligrams (mg) of sodium per serving fit into this eating plan. To find whole grains, look for the word "whole" as the first word in the ingredient list. Shopping Buy products labeled as "low-sodium" or "no salt added."  Buy fresh foods. Avoid canned foods and pre-made or frozen meals. Cooking Avoid adding salt when cooking. Use salt-free seasonings or herbs instead of table salt or sea salt. Check with your health care provider or pharmacist before using salt substitutes. Do not fry foods. Cook foods using healthy methods such as baking, boiling, grilling, roasting, and broiling instead. Cook with heart-healthy oils, such as olive, canola, avocado, soybean, or sunflower oil. Meal planning  Eat a balanced diet that includes: 4 or more servings of fruits and 4 or more servings of vegetables each day. Try to fill one-half of your plate with fruits and vegetables. 6-8 servings of  whole grains each day. Less than 6 oz (170 g) of lean meat, poultry, or fish each day. A 3-oz (85-g) serving of meat is about the same size as a deck of cards. One egg equals 1 oz (28 g). 2-3 servings of low-fat dairy each day. One serving is 1 cup (237 mL). 1 serving of nuts, seeds, or beans 5 times each week. 2-3 servings of heart-healthy fats. Healthy fats called omega-3 fatty acids are found in foods such as walnuts, flaxseeds, fortified milks, and eggs. These fats are also found in cold-water fish, such as sardines, salmon, and mackerel. Limit how much you eat of: Canned or prepackaged foods. Food that is high in trans fat, such as some fried foods. Food that is high in saturated fat, such as fatty meat. Desserts and other sweets, sugary drinks, and other foods with added sugar. Full-fat dairy products. Do not salt foods before eating. Do not eat more than 4 egg yolks a week. Try to eat at least 2 vegetarian meals a week. Eat more home-cooked food and less restaurant, buffet, and fast food.  Lifestyle When eating at a restaurant, ask that your food be prepared with less salt or no salt, if possible. If you drink alcohol: Limit how much you use to: 0-1 drink a day for women who are not pregnant. 0-2 drinks a day for men. Be aware of how much alcohol is in your drink. In the [place] one drink equals one 12 oz bottle of beer (355 mL), one 5 oz glass of wine (148 mL), or one 1 oz glass of hard liquor (44 mL). General information Avoid eating more than 2,300 mg of salt a day. If you have hypertension, you may need to reduce your sodium intake to 1,500 mg a day. Work with your health care provider to maintain a healthy body weight or to lose weight. Ask what an ideal weight is for you. Get at least 30 minutes of exercise that causes your heart to beat faster (aerobic exercise) most days of the week. Activities may include walking, swimming, or biking. Work with your health care provider or  dietitian to adjust your eating plan to your individual calorie needs. What foods should I eat? Fruits All fresh, dried, or frozen fruit. Canned fruit in natural juice (without addedsugar). Vegetables Fresh or frozen vegetables (raw, steamed, roasted, or grilled). Low-sodium or reduced-sodium tomato and vegetable juice. Low-sodium or reduced-sodium tomatosauce and tomato paste. Low-sodium or reduced-sodium canned vegetables. Grains Whole-grain or whole-wheat bread. Whole-grain or whole-wheat pasta. Brown rice. [place]. Bulgur. Whole-grain and low-sodium cereals. Pita bread.Low-fat, low-sodium crackers. Whole-wheat flour tortillas. Meats and other proteins Skinless chicken or [place]. Ground chicken or [place]. Pork with fat trimmed off. Fish and seafood. Egg whites. Dried beans, peas, or lentils. Unsalted nuts, nut butters, and seeds. Unsalted canned beans. Lean cuts of  beef with fat trimmed off. Low-sodium, lean precooked or cured meat, such as sausages or meatloaves. Dairy Low-fat (1%) or fat-free (skim) milk. Reduced-fat, low-fat, or fat-free cheeses. Nonfat, low-sodium ricotta or cottage cheese. Low-fat or nonfatyogurt. Low-fat, low-sodium cheese. Fats and oils Soft margarine without trans fats. Vegetable oil. Reduced-fat, low-fat, or light mayonnaise and salad dressings (reduced-sodium). Canola, safflower, olive, avocado, soybean, andsunflower oils. Avocado. Seasonings and condiments Herbs. Spices. Seasoning mixes without salt. Other foods Unsalted popcorn and pretzels. Fat-free sweets. The items listed above may not be a complete list of foods and beverages you can eat. Contact a dietitian for more information. What foods should I avoid? Fruits Canned fruit in a light or heavy syrup. Fried fruit. Fruit in cream or buttersauce. Vegetables Creamed or fried vegetables. Vegetables in a cheese sauce. Regular canned vegetables (not low-sodium or reduced-sodium). Regular canned tomato  sauce and paste (not low-sodium or reduced-sodium). Regular tomato and vegetable juice(not low-sodium or reduced-sodium). Angie Fava. Olives. Grains Baked goods made with fat, such as croissants, muffins, or some breads. Drypasta or rice meal packs. Meats and other proteins Fatty cuts of meat. Ribs. Fried meat. Berniece Salines. Bologna, salami, and other precooked or cured meats, such as sausages or meat loaves. Fat from the back of a pig (fatback). Bratwurst. Salted nuts and seeds. Canned beans with added salt. Canned orsmoked fish. Whole eggs or egg yolks. Chicken or [place] with skin. Dairy Whole or 2% milk, cream, and half-and-half. Whole or full-fat cream cheese. Whole-fat or sweetened yogurt. Full-fat cheese. Nondairy creamers. Whippedtoppings. Processed cheese and cheese spreads. Fats and oils Butter. Stick margarine. Lard. Shortening. Ghee. Bacon fat. Tropical oils, suchas coconut, palm kernel, or palm oil. Seasonings and condiments Onion salt, garlic salt, seasoned salt, table salt, and sea salt. Worcestershire sauce. Tartar sauce. Barbecue sauce. Teriyaki sauce. Soy sauce, including reduced-sodium. Steak sauce. Canned and packaged gravies. Fish sauce. Oyster sauce. Cocktail sauce. Store-bought horseradish. Ketchup. Mustard. Meat flavorings and tenderizers. Bouillon cubes. Hot sauces. Pre-made or packaged marinades. Pre-made or packaged taco seasonings. Relishes. Regular saladdressings. Other foods Salted popcorn and pretzels. The items listed above may not be a complete list of foods and beverages you should avoid. Contact a dietitian for more information. Where to find more information National Heart, Lung, and Blood Institute: https://wilson-eaton.com/ American Heart Association: www.heart.org Academy of Nutrition and Dietetics: www.eatright.[place]: www.kidney.org Summary The DASH eating plan is a healthy eating plan that has been shown to reduce high blood pressure (hypertension).  It may also reduce your risk for type 2 diabetes, heart disease, and stroke. When on the DASH eating plan, aim to eat more fresh fruits and vegetables, whole grains, lean proteins, low-fat dairy, and heart-healthy fats. With the DASH eating plan, you should limit salt (sodium) intake to 2,300 mg a day. If you have hypertension, you may need to reduce your sodium intake to 1,500 mg a day. Work with your health care provider or dietitian to adjust your eating plan to your individual calorie needs. This information is not intended to replace advice given to you by your health care provider. Make sure you discuss any questions you have with your healthcare provider. Document Revised: 10/31/2019 Document Reviewed: 10/31/2019 Elsevier Patient Education  2022 [employer].

## 2021-05-24 NOTE — Progress Notes (Signed)
Chief Complaint  Patient presents with   Annual Exam   Annual  Elevated blood pressure today filled out form for work pending hip replacement with Dr. Harlow Mares 06/2021 doing well overall no sx's with elevated BP sl FH HTN declines meds for now    Review of Systems  Constitutional:  Negative for weight loss.  HENT:  Negative for hearing loss.   Eyes:  Negative for blurred vision.  Respiratory:  Negative for shortness of breath.   Cardiovascular:  Negative for chest pain.  Gastrointestinal:  Negative for abdominal pain.  Musculoskeletal:  Positive for joint pain. Negative for falls.  Skin:  Negative for rash.  Neurological:  Negative for headaches.  Psychiatric/Behavioral:  Negative for depression.   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   Hip pain, acute, right    HSV-2 (herpes simplex virus 2) infection    Obesity (BMI 30-39.9) 12/19/2018   Plantar fasciitis    Prediabetes 12/19/2018   Right hip pain 09/18/2018   Vitamin D deficiency    Past Surgical History:  Procedure Laterality Date   NO PAST SURGERIES     Family History  Problem Relation Age of Onset   Heart disease Mother        age 80    Hypertension Mother    Diabetes Sister        DM 2    Social History   Socioeconomic History   Marital status: Single    Spouse name: Not on file   Number of children: Not on file   Years of education: Not on file   Highest education level: Not on file  Occupational History   Not on file  Tobacco Use   Smoking status: Some Days    Packs/day: 0.50    Pack years: 0.00    Types: Cigarettes   Smokeless tobacco: Never  Substance and Sexual Activity   Alcohol use: Yes    Alcohol/week: 14.0 standard drinks    Types: 14 Cans of beer per week   Drug use: No   Sexual activity: Yes  Other Topics  Concern   Not on file  Social History Narrative   [employer]    4 kids 1 bio    Married    No guns    Wears seat belt    Safe in relationship    Former marine    Social Determinants of Radio broadcast assistant Strain: Not on file  Food Insecurity: Not on file  Transportation Needs: Not on file  Physical Activity: Not on file  Stress: Not on file  Social Connections: Not on file  Intimate Partner Violence: Not on file   Current Meds  Medication Sig   ibuprofen (ADVIL,MOTRIN) 600 MG tablet Take by mouth.   No Known Allergies No results found for this or any previous visit (from the past 2160 hour(s)). Objective  Body mass index is 32.82 kg/m. Wt Readings from Last 3 Encounters:  05/24/21 242 lb (109.8 kg)  08/13/19 240 lb (108.9 kg)  12/19/18 252 lb 6.4 oz (114.5 kg)   Temp Readings from Last 3 Encounters:  05/24/21 97.8 F (36.6 C) (Oral)  08/13/19 98.9 F (37.2 C) (Oral)  12/19/18 97.7 F (36.5 C) (Oral)   BP Readings from Last 3 Encounters:  05/24/21 136/82  08/13/19 (!) 155/91  12/19/18 116/78   Pulse Readings from Last 3 Encounters:  05/24/21 63  08/13/19 65  12/19/18 (!) 58    Physical Exam Vitals and nursing note reviewed.  Constitutional:      Appearance: Normal appearance. He is well-developed and well-groomed. He is obese.  HENT:     Head: Normocephalic and atraumatic.  Eyes:     Conjunctiva/sclera: Conjunctivae normal.     Pupils: Pupils are equal, round, and reactive to light.  Cardiovascular:     Rate and Rhythm: Normal rate and regular rhythm.     Heart sounds: Normal heart sounds. No murmur heard. Pulmonary:     Effort: Pulmonary effort is normal.     Breath sounds: Normal breath sounds.  Abdominal:     General: Abdomen is flat. Bowel sounds are normal.     Tenderness: There is no abdominal tenderness.  Skin:    General: Skin is warm and moist.  Neurological:     General: No focal deficit present.     Mental Status:  He is alert and oriented to person, place, and time.     Gait: Gait normal.  Psychiatric:        Attention and Perception: Attention and perception normal.        Mood and Affect: Mood and affect normal.        Speech: Speech normal.        Behavior: Behavior normal. Behavior is cooperative.        Thought Content: Thought content normal.        Cognition and Memory: Cognition normal.        Judgment: Judgment normal.    Assessment  Plan  Annual physical exam - Plan: CBC with Differential/Platelet, Comprehensive metabolic panel, Hemoglobin A1c, Lipid panel,  See below   Elevated blood pressure reading - with FH wo sxs monitor for now declines meds f/u in 6 months    Hyperglycemia - Plan: Hemoglobin A1c,  Thyroid disorder screening - Plan: TSH,   Reduced vision - Plan: Ambulatory referral to Ophthalmology   HM  Declines flu shot Pfizer 3/3 utd per pt need card  Tdap had 08/01/17  Vit D def on vitamin d Prediabetes HSV 2 wife has and he knows about It prn Valtrex rec smoking cessation <1 ppd x 30 years no FH lung  Disc colonoscopy let me know when ready for referral hip replacement Dr. Harlow Mares sch 06/2021  PSA age 30    Provider: Dr. Olivia Mackie McLean-[facility]ocuz[facility]

## 2021-05-26 ENCOUNTER — Other Ambulatory Visit: Payer: Self-pay

## 2021-05-26 ENCOUNTER — Other Ambulatory Visit (INDEPENDENT_AMBULATORY_CARE_PROVIDER_SITE_OTHER): Payer: Managed Care, Other (non HMO)

## 2021-05-26 DIAGNOSIS — Z20822 Contact with and (suspected) exposure to covid-19: Secondary | ICD-10-CM

## 2021-05-26 DIAGNOSIS — Z0000 Encounter for general adult medical examination without abnormal findings: Secondary | ICD-10-CM

## 2021-05-26 DIAGNOSIS — Z1389 Encounter for screening for other disorder: Secondary | ICD-10-CM

## 2021-05-26 DIAGNOSIS — Z1329 Encounter for screening for other suspected endocrine disorder: Secondary | ICD-10-CM

## 2021-05-26 DIAGNOSIS — E559 Vitamin D deficiency, unspecified: Secondary | ICD-10-CM

## 2021-05-26 DIAGNOSIS — R030 Elevated blood-pressure reading, without diagnosis of hypertension: Secondary | ICD-10-CM

## 2021-05-26 DIAGNOSIS — R739 Hyperglycemia, unspecified: Secondary | ICD-10-CM

## 2021-05-27 ENCOUNTER — Other Ambulatory Visit: Payer: Self-pay | Admitting: Internal Medicine

## 2021-05-27 ENCOUNTER — Telehealth: Payer: Self-pay | Admitting: Internal Medicine

## 2021-05-27 DIAGNOSIS — E559 Vitamin D deficiency, unspecified: Secondary | ICD-10-CM

## 2021-05-27 LAB — CBC WITH DIFFERENTIAL/PLATELET
Basophils Absolute: 0 10*3/uL (ref 0.0–0.2)
Basos: 1 %
EOS (ABSOLUTE): 0.2 10*3/uL (ref 0.0–0.4)
Eos: 3 %
Hematocrit: 40.2 % (ref 37.5–51.0)
Hemoglobin: 12.8 g/dL — ABNORMAL LOW (ref 13.0–17.7)
Immature Grans (Abs): 0 10*3/uL (ref 0.0–0.1)
Immature Granulocytes: 0 %
Lymphocytes Absolute: 2.5 10*3/uL (ref 0.7–3.1)
Lymphs: 38 %
MCH: 25 pg — ABNORMAL LOW (ref 26.6–33.0)
MCHC: 31.8 g/dL (ref 31.5–35.7)
MCV: 79 fL (ref 79–97)
Monocytes Absolute: 0.6 10*3/uL (ref 0.1–0.9)
Monocytes: 10 %
Neutrophils Absolute: 3.1 10*3/uL (ref 1.4–7.0)
Neutrophils: 48 %
Platelets: 183 10*3/uL (ref 150–450)
RBC: 5.12 x10E6/uL (ref 4.14–5.80)
RDW: 14.4 % (ref 11.6–15.4)
WBC: 6.4 10*3/uL (ref 3.4–10.8)

## 2021-05-27 LAB — COMPREHENSIVE METABOLIC PANEL
ALT: 34 IU/L (ref 0–44)
AST: 30 IU/L (ref 0–40)
Albumin/Globulin Ratio: 1.7 (ref 1.2–2.2)
Albumin: 4.1 g/dL (ref 4.0–5.0)
Alkaline Phosphatase: 70 IU/L (ref 44–121)
BUN/Creatinine Ratio: 18 (ref 9–20)
BUN: 16 mg/dL (ref 6–24)
Bilirubin Total: <0.2 mg/dL (ref 0.0–1.2)
CO2: 20 mmol/L (ref 20–29)
Calcium: 8.7 mg/dL (ref 8.7–10.2)
Chloride: 106 mmol/L (ref 96–106)
Creatinine, Ser: 0.89 mg/dL (ref 0.76–1.27)
Globulin, Total: 2.4 g/dL (ref 1.5–4.5)
Glucose: 108 mg/dL — ABNORMAL HIGH (ref 65–99)
Potassium: 3.9 mmol/L (ref 3.5–5.2)
Sodium: 141 mmol/L (ref 134–144)
Total Protein: 6.5 g/dL (ref 6.0–8.5)
eGFR: 108 mL/min/{1.73_m2} (ref >59)

## 2021-05-27 LAB — URINALYSIS, ROUTINE W REFLEX MICROSCOPIC
Bilirubin, UA: NEGATIVE
Glucose, UA: NEGATIVE
Ketones, UA: NEGATIVE
Leukocytes,UA: NEGATIVE
Nitrite, UA: NEGATIVE
Protein,UA: NEGATIVE
RBC, UA: NEGATIVE
Specific Gravity, UA: 1.021 (ref 1.005–1.030)
Urobilinogen, Ur: 0.2 mg/dL (ref 0.2–1.0)
pH, UA: 5.5 (ref 5.0–7.5)

## 2021-05-27 LAB — LIPID PANEL
Chol/HDL Ratio: 3.2 ratio (ref 0.0–5.0)
Cholesterol, Total: 177 mg/dL (ref 100–199)
HDL: 56 mg/dL (ref >39)
LDL Chol Calc (NIH): 97 mg/dL (ref 0–99)
Triglycerides: 139 mg/dL (ref 0–149)
VLDL Cholesterol Cal: 24 mg/dL (ref 5–40)

## 2021-05-27 LAB — SARS-COV-2 SEMI-QUANTITATIVE TOTAL ANTIBODY, SPIKE: SARS-CoV-2 Spike Ab Interp: POSITIVE

## 2021-05-27 LAB — HEMOGLOBIN A1C
Est. average glucose Bld gHb Est-mCnc: 114 mg/dL
Hgb A1c MFr Bld: 5.6 % (ref 4.8–5.6)

## 2021-05-27 LAB — SARS-COV-2 SPIKE AB DILUTION: SARS-CoV-2 Spike Ab Dilution: 5079 U/mL (ref <0.8)

## 2021-05-27 LAB — TSH: TSH: 1.28 u[IU]/mL (ref 0.450–4.500)

## 2021-05-27 LAB — VITAMIN D 25 HYDROXY (VIT D DEFICIENCY, FRACTURES): Vit D, 25-Hydroxy: 17.1 ng/mL — ABNORMAL LOW (ref 30.0–100.0)

## 2021-05-27 MED ORDER — CHOLECALCIFEROL 1.25 MG (50000 UT) PO CAPS: 50000.0000 [IU] | ORAL_CAPSULE | ORAL | 1 refills | Status: AC

## 2021-05-27 NOTE — Telephone Encounter (Signed)
Left message to return call. Physical form completed and placed upfront for pick up

## 2021-05-27 NOTE — Telephone Encounter (Signed)
Patient calling back in and was informed

## 2021-08-01 NOTE — Telephone Encounter (Signed)
Patient's wife calling in. Verified that form is still at the front desk. States she will come in to pick this up

## 2021-10-27 ENCOUNTER — Other Ambulatory Visit: Payer: Self-pay

## 2021-10-27 ENCOUNTER — Telehealth: Payer: Self-pay | Admitting: Internal Medicine

## 2021-10-27 DIAGNOSIS — E559 Vitamin D deficiency, unspecified: Secondary | ICD-10-CM

## 2021-10-27 DIAGNOSIS — Z1389 Encounter for screening for other disorder: Secondary | ICD-10-CM

## 2021-10-27 DIAGNOSIS — Z0000 Encounter for general adult medical examination without abnormal findings: Secondary | ICD-10-CM

## 2021-10-27 NOTE — Telephone Encounter (Signed)
Calling Patient to schedule fasting lab appointment 3-7 days before their 12/14 appointment.   No answer, no voicemail.

## 2021-11-23 ENCOUNTER — Ambulatory Visit (INDEPENDENT_AMBULATORY_CARE_PROVIDER_SITE_OTHER): Payer: Managed Care, Other (non HMO) | Admitting: Internal Medicine

## 2021-11-23 ENCOUNTER — Other Ambulatory Visit: Payer: Self-pay

## 2021-11-23 ENCOUNTER — Encounter: Payer: Self-pay | Admitting: Internal Medicine

## 2021-11-23 VITALS — BP 128/78 | HR 63 | Temp 97.3°F | Ht 72.0 in | Wt 245.1 lb

## 2021-11-23 DIAGNOSIS — Z0000 Encounter for general adult medical examination without abnormal findings: Secondary | ICD-10-CM

## 2021-11-23 DIAGNOSIS — Z13220 Encounter for screening for lipoid disorders: Secondary | ICD-10-CM | POA: Diagnosis not present

## 2021-11-23 DIAGNOSIS — E559 Vitamin D deficiency, unspecified: Secondary | ICD-10-CM

## 2021-11-23 DIAGNOSIS — Z125 Encounter for screening for malignant neoplasm of prostate: Secondary | ICD-10-CM

## 2021-11-23 DIAGNOSIS — R030 Elevated blood-pressure reading, without diagnosis of hypertension: Secondary | ICD-10-CM

## 2021-11-23 DIAGNOSIS — Z1329 Encounter for screening for other suspected endocrine disorder: Secondary | ICD-10-CM | POA: Diagnosis not present

## 2021-11-23 DIAGNOSIS — Z1211 Encounter for screening for malignant neoplasm of colon: Secondary | ICD-10-CM

## 2021-11-23 DIAGNOSIS — R739 Hyperglycemia, unspecified: Secondary | ICD-10-CM

## 2021-11-23 DIAGNOSIS — Z1389 Encounter for screening for other disorder: Secondary | ICD-10-CM

## 2021-11-23 NOTE — Patient Instructions (Addendum)
Pfizer bivalent consider getting another dose Pick up 1x per week vitamin D pharmacy after this is complete for 3 month dose take D3 4000 IU daily   Dr. Georgiana Shore for an appointment   Phone Fax E-mail Address  [phone] [address] [address]     Specialties     Gastroenterology            Colonoscopy, Adult A colonoscopy is a procedure to look at the entire large intestine. This procedure is done using a long, thin, flexible tube that has a camera on the end. You may have a colonoscopy: As a part of normal colorectal screening. If you have certain symptoms, such as: A low number of red blood cells in your blood (anemia). Diarrhea that does not go away. Pain in your abdomen. Blood in your stool. A colonoscopy can help screen for and diagnose medical problems, including: Tumors. Extra tissue that grows where mucus forms (polyps). Inflammation. Areas of bleeding. Tell your health care provider about: Any allergies you have. All medicines you are taking, including vitamins, herbs, eye drops, creams, and over-the-counter medicines. Any problems you or family members have had with anesthetic medicines. Any blood disorders you have. Any surgeries you have had. Any medical conditions you have. Any problems you have had with having bowel movements. Whether you are pregnant or may be pregnant. What are the risks? Generally, this is a safe procedure. However, problems may occur, including: Bleeding. Damage to your intestine. Allergic reactions to medicines given during the procedure. Infection. This is rare. What happens before the procedure? Eating and drinking restrictions Follow instructions from your health care provider about eating or drinking restrictions, which may include: A few days before the procedure: Follow a low-fiber diet. Avoid nuts, seeds, dried fruit, raw fruits, and vegetables. 1-3 days before  the procedure: Eat only gelatin dessert or ice pops. Drink only clear liquids, such as water, clear juice, clear broth or bouillon, black coffee or tea, or clear soft drinks or sports drinks. Avoid liquids that contain red or purple dye. The day of the procedure: Do not eat solid foods. You may continue to drink clear liquids until up to 2 hours before the procedure. Do not eat or drink anything starting 2 hours before the procedure, or within the time period that your health care provider recommends. Bowel prep If you were prescribed a bowel prep to take by mouth (orally) to clean out your colon: Take it as told by your health care provider. Starting the day before your procedure, you will need to drink a large amount of liquid medicine. The liquid will cause you to have many bowel movements of loose stool until your stool becomes almost clear or light green. If your skin or the opening between the buttocks (anus) gets irritated from diarrhea, you may relieve the irritation using: Wipes with medicine in them, such as adult wet wipes with aloe and vitamin E. A product to soothe skin, such as petroleum jelly. If you vomit while drinking the bowel prep: Take a break for up to 60 minutes. Begin the bowel prep again. Call your health care provider if you keep vomiting or you cannot take the bowel prep without vomiting. To clean out your colon, you may also be given: Laxative medicines. These help you have a bowel movement. Instructions for enema use. An enema is liquid medicine injected into your rectum. Medicines Ask your health care provider about: Changing or  stopping your regular medicines or supplements. This is especially important if you are taking iron supplements, diabetes medicines, or blood thinners. Taking medicines such as aspirin and ibuprofen. These medicines can thin your blood. Do not take these medicines unless your health care provider tells you to take them. Taking  over-the-counter medicines, vitamins, herbs, and supplements. General instructions Ask your health care provider what steps will be taken to help prevent infection. These may include washing skin with a germ-killing soap. Plan to have someone take you home from the hospital or clinic. What happens during the procedure?  An IV will be inserted into one of your veins. You may be given one or more of the following: A medicine to help you relax (sedative). A medicine to numb the area (local anesthetic). A medicine to make you fall asleep (general anesthetic). This is rarely needed. You will lie on your side with your knees bent. The tube will: Have oil or gel put on it (be lubricated). Be inserted into your anus. Be gently eased through all parts of your large intestine. Air will be sent into your colon to keep it open. This may cause some pressure or cramping. Images will be taken with the camera and will appear on a screen. A small tissue sample may be removed to be looked at under a microscope (biopsy). The tissue may be sent to a lab for testing if any signs of problems are found. If small polyps are found, they may be removed and checked for cancer cells. When the procedure is finished, the tube will be removed. The procedure may vary among health care providers and hospitals. What happens after the procedure? Your blood pressure, heart rate, breathing rate, and blood oxygen level will be monitored until you leave the hospital or clinic. You may have a small amount of blood in your stool. You may pass gas and have mild cramping or bloating in your abdomen. This is caused by the air that was used to open your colon during the exam. Do not drive for 24 hours after the procedure. It is up to you to get the results of your procedure. Ask your health care provider, or the department that is doing the procedure, when your results will be ready. Summary A colonoscopy is a procedure to look at  the entire large intestine. Follow instructions from your health care provider about eating and drinking before the procedure. If you were prescribed an oral bowel prep to clean out your colon, take it as told by your health care provider. During the colonoscopy, a flexible tube with a camera on its end is inserted into the anus and then passed into the other parts of the large intestine. This information is not intended to replace advice given to you by your health care provider. Make sure you discuss any questions you have with your health care provider. Document Revised: 06/20/2019 Document Reviewed: 06/20/2019 Elsevier Patient Education  [place].

## 2021-11-23 NOTE — Progress Notes (Signed)
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 Harlow Mares   Family History  Problem Relation Age of Onset   Heart disease Mother        age 13    Hypertension Mother    Diabetes Sister        DM 2    Cancer Other        ?type cancer dads side of family   Social History   Socioeconomic History   Marital status: Single    Spouse name: Not on file   Number of children: Not on file   Years of education: Not on file   Highest education level: Not on file  Occupational History   Not on file  Tobacco Use   Smoking status: Some Days    Packs/day: 0.50    Types: Cigarettes   Smokeless tobacco: Never  Substance and Sexual Activity   Alcohol use: Yes    Alcohol/week: 14.0 standard drinks    Types: 14 Cans of beer per week   Drug use: No   Sexual activity: Yes  Other Topics Concern   Not on file  Social History Narrative   [employer]    4 kids 1 bio    Married    No guns    Wears seat belt    Safe in relationship    Former marine    Social Determinants of Radio broadcast assistant Strain: Not on file  Food Insecurity: Not on file  Transportation Needs: Not on file  Physical Activity: Not on file  Stress: Not on file  Social Connections: Not on file  Intimate Partner Violence: Not on file   Current Meds  Medication Sig   Cholecalciferol 1.25 MG (50000 UT) capsule Take 1 capsule (50,000 Units total) by mouth once a  week. Vitamin D3   ibuprofen (ADVIL,MOTRIN) 600 MG tablet Take by mouth.   No Known Allergies No results found for this or any previous visit (from the past 2160 hour(s)). Objective  Body mass index is 33.24 kg/m. Wt Readings from Last 3 Encounters:  11/23/21 245 lb 1.6 oz (111.2 kg)  05/24/21 242 lb (109.8 kg)  08/13/19 240 lb (108.9 kg)   Temp Readings from Last 3 Encounters:  11/23/21 (!) 97.3 F (36.3 C) (Temporal)  05/24/21 97.8 F (36.6 C) (Oral)  08/13/19 98.9 F (37.2 C) (Oral)   BP Readings from Last 3 Encounters:  11/23/21 128/78  05/24/21 136/82  08/13/19 (!) 155/91   Pulse Readings from Last 3 Encounters:  11/23/21 63  05/24/21 63  08/13/19 65    Physical Exam  Assessment  Plan  Elevated blood pressure reading Improved at goal monitor 1x per week at least   Vitamin D deficiency - Plan: Vitamin D (25 hydroxy) Rec D3 weekly 50K x 3 months then 4000 IU daily otc  Hyperglycemia - Plan: Hemoglobin A1c  HM-CPE due after 05/2022 Declines flu shot Pfizer 3/3 utd consider 4th dose Tdap had 08/01/17  Vit D def on vitamin d Prediabetes HSV 2 wife has and he knows about It prn Valtrex rec smoking cessation <1 ppd x 30 years no FH lung  Referred colonoscopy Dr. Francesca Jewett PSA age 89 BL at 23   Provider: Dr. Olivia Mackie McLean-Scocuzza-[facility]

## 2022-02-09 ENCOUNTER — Encounter: Payer: Self-pay | Admitting: Internal Medicine

## 2022-06-15 ENCOUNTER — Telehealth: Payer: Self-pay | Admitting: Gastroenterology

## 2022-06-15 ENCOUNTER — Telehealth: Payer: Self-pay

## 2022-06-15 NOTE — Telephone Encounter (Signed)
Patient's wife, Kimmy Parish, states patient missed call from [place] regarding referral.  Zettie Cooley states she would like to know who we referred patient to for colonoscopy so she can call them to schedule an appointment.  I gave patient the phone number for [facility] [phone]).

## 2022-06-17 NOTE — Telephone Encounter (Signed)
OK for direct schedule if it is for screening. If he has symptoms then he needs an office visit. Unclear when I'm receiving this now from a 11/23/2021 DOD.

## 2022-06-28 ENCOUNTER — Ambulatory Visit (AMBULATORY_SURGERY_CENTER): Payer: Managed Care, Other (non HMO)

## 2022-06-28 VITALS — Ht 72.0 in | Wt 242.6 lb

## 2022-06-28 DIAGNOSIS — Z1211 Encounter for screening for malignant neoplasm of colon: Secondary | ICD-10-CM

## 2022-06-28 MED ORDER — NA SULFATE-K SULFATE-MG SULF 17.5-3.13-1.6 GM/177ML PO SOLN: 1.0000 | Freq: Once | ORAL | 0 refills | Status: AC

## 2022-06-28 NOTE — Progress Notes (Signed)
No egg or soy allergy known to patient  No issues known to pt with past sedation with any surgeries or procedures Patient denies ever being told they had issues or difficulty with intubation  No FH of Malignant Hyperthermia Pt is not on diet pills Pt is not on  home 02  Pt is not on blood thinners  Pt denies issues with constipation  No A fib or A flutter Have any cardiac testing pending--no Pt instructed to use [email or web address] or GoodRx for a price reduction on prep   

## 2022-07-13 ENCOUNTER — Encounter: Payer: Self-pay | Admitting: Gastroenterology

## 2022-07-18 ENCOUNTER — Encounter: Payer: Self-pay | Admitting: Gastroenterology

## 2022-07-18 ENCOUNTER — Ambulatory Visit: Payer: Managed Care, Other (non HMO) | Admitting: Gastroenterology

## 2022-07-18 VITALS — BP 129/85 | HR 59 | Temp 98.3°F | Resp 18 | Ht 72.0 in | Wt 242.6 lb

## 2022-07-18 DIAGNOSIS — D128 Benign neoplasm of rectum: Secondary | ICD-10-CM

## 2022-07-18 DIAGNOSIS — D125 Benign neoplasm of sigmoid colon: Secondary | ICD-10-CM

## 2022-07-18 DIAGNOSIS — D120 Benign neoplasm of cecum: Secondary | ICD-10-CM | POA: Diagnosis not present

## 2022-07-18 DIAGNOSIS — Z1211 Encounter for screening for malignant neoplasm of colon: Secondary | ICD-10-CM | POA: Diagnosis present

## 2022-07-18 DIAGNOSIS — D122 Benign neoplasm of ascending colon: Secondary | ICD-10-CM

## 2022-07-18 MED ORDER — SODIUM CHLORIDE 0.9 % IV SOLN: 500.0000 mL | Freq: Once | INTRAVENOUS | Status: DC

## 2022-07-18 NOTE — Progress Notes (Signed)
Pt's states no medical or surgical changes since previsit or office visit. 

## 2022-07-18 NOTE — Patient Instructions (Signed)
  HANDOUTS ON POLYPS AND HEMORRHOIDS GIVEN.  YOU HAD AN ENDOSCOPIC PROCEDURE TODAY AT [facility]:   Refer to the procedure report that was given to you for any specific questions about what was found during the examination.  If the procedure report does not answer your questions, please call your gastroenterologist to clarify.  If you requested that your care partner not be given the details of your procedure findings, then the procedure report has been included in a sealed envelope for you to review at your convenience later.  YOU SHOULD EXPECT: Some feelings of bloating in the abdomen. Passage of more gas than usual.  Walking can help get rid of the air that was put into your GI tract during the procedure and reduce the bloating. If you had a lower endoscopy (such as a colonoscopy or flexible sigmoidoscopy) you may notice spotting of blood in your stool or on the toilet paper. If you underwent a bowel prep for your procedure, you may not have a normal bowel movement for a few days.  Please Note:  You might notice some irritation and congestion in your nose or some drainage.  This is from the oxygen used during your procedure.  There is no need for concern and it should clear up in a day or so.  SYMPTOMS TO REPORT IMMEDIATELY:  Following lower endoscopy (colonoscopy or flexible sigmoidoscopy):  Excessive amounts of blood in the stool  Significant tenderness or worsening of abdominal pains  Swelling of the abdomen that is new, acute Fever of 100F or higher   For urgent or emergent issues, a gastroenterologist can be reached at any hour by calling [phone]. Do not use MyChart messaging for urgent concerns.    DIET:  We do recommend a small meal at first, but then you may proceed to your regular diet.  Drink plenty of fluids but you should avoid alcoholic beverages for 24 hours.  ACTIVITY:  You should plan to take it easy for the rest of today and you should NOT DRIVE or  use heavy machinery until tomorrow (because of the sedation medicines used during the test).    FOLLOW UP: Our staff will call the number listed on your records the next business day following your procedure.  We will call around 7:15- 8:00 am to check on you and address any questions or concerns that you may have regarding the information given to you following your procedure. If we do not reach you, we will leave a message.  If you develop any symptoms (ie: fever, flu-like symptoms, shortness of breath, cough etc.) before then, please call [phone].  If you test positive for Covid 19 in the 2 weeks post procedure, please call and report this information to [place].    If any biopsies were taken you will be contacted by phone or by letter within the next 1-3 weeks.  Please call us at [phone] if you have not heard about the biopsies in 3 weeks.    SIGNATURES/CONFIDENTIALITY: You and/or your care partner have signed paperwork which will be entered into your electronic medical record.  These signatures attest to the fact that that the information above on your After Visit Summary has been reviewed and is understood.  Full responsibility of the confidentiality of this discharge information lies with you and/or your care-partner.

## 2022-07-18 NOTE — Progress Notes (Signed)
Sedate, gd SR, tolerated procedure well, VSS, report to RN 

## 2022-07-18 NOTE — Progress Notes (Signed)
History & Physical  Primary Care Physician:  McLean-Scocuzza, Nino Glow, MD Primary Gastroenterologist: Lucio Edward, MD  CHIEF COMPLAINT:  CRC screening  HPI: Brian Reyes is a 46 y.o. male average risk CRC screening with colonoscopy.   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 Harlow Mares    Prior to Admission medications   Medication Sig Start Date End Date Taking? Authorizing Provider  Cholecalciferol 1.25 MG (50000 UT) capsule Take 1 capsule (50,000 Units total) by mouth once a week. Vitamin D3 Patient not taking: Reported on 06/28/2022 05/27/21   McLean-Scocuzza, Nino Glow, MD  diclofenac sodium (VOLTAREN) 1 % GEL Apply 4 g topically 4 (four) times daily. B/l hips osteoarthritis Patient not taking: Reported on 05/24/2021 06/17/19   McLean-Scocuzza, Nino Glow, MD  ibuprofen (ADVIL,MOTRIN) 600 MG tablet Take by mouth in the morning and at bedtime.    [provider]  omeprazole (PRILOSEC) 40 MG capsule Take 1 capsule (40 mg total) by mouth daily. 30 minutes before food Patient not taking: Reported on 05/24/2021 06/17/19   McLean-Scocuzza, Nino Glow, MD    Current Outpatient Medications  Medication Sig Dispense Refill   Cholecalciferol 1.25 MG (50000 UT) capsule Take 1 capsule (50,000 Units total) by mouth once a week. Vitamin D3 (Patient not taking: Reported on 06/28/2022) 13 capsule 1   diclofenac sodium  (VOLTAREN) 1 % GEL Apply 4 g topically 4 (four) times daily. B/l hips osteoarthritis (Patient not taking: Reported on 05/24/2021) 100 g 11   ibuprofen (ADVIL,MOTRIN) 600 MG tablet Take by mouth in the morning and at bedtime.     omeprazole (PRILOSEC) 40 MG capsule Take 1 capsule (40 mg total) by mouth daily. 30 minutes before food (Patient not taking: Reported on 05/24/2021) 30 capsule 1   Current Facility-Administered Medications  Medication Dose Route Frequency Provider Last Rate Last Admin   0.9 %  sodium chloride infusion  500 mL Intravenous Once Ladene Artist, MD        Allergies as of 07/18/2022   (No Known Allergies)    Family History  Problem Relation Age of Onset   Heart disease Mother        age 46    Hypertension Mother    Diabetes Sister        DM 2    Cancer Other        ?type cancer dads side of family   Colon cancer Neg Hx    Colon polyps Neg Hx    Crohn's disease Neg Hx    Esophageal cancer Neg Hx    Rectal cancer Neg Hx    Stomach cancer Neg Hx     Social History   Socioeconomic History   Marital status: Single    Spouse name: Not on file   Number of children: Not on file  Years of education: Not on file   Highest education level: Not on file  Occupational History   Not on file  Tobacco Use   Smoking status: Every Day    Packs/day: 0.50    Types: Cigarettes    Passive exposure: Current   Smokeless tobacco: Never  Vaping Use   Vaping Use: Some days   Substances: Flavoring  Substance and Sexual Activity   Alcohol use: Yes    Alcohol/week: 14.0 standard drinks of alcohol    Types: 14 Cans of beer per week    Comment: daily,jack daniel   Drug use: No   Sexual activity: Yes  Other Topics Concern   Not on file  Social History Narrative   [employer]    4 kids 1 bio    Married    No guns    Wears seat belt    Safe in relationship    Former marine    Social Determinants of Radio broadcast assistant Strain: Not on file  Food  Insecurity: Not on file  Transportation Needs: Not on file  Physical Activity: Not on file  Stress: Not on file  Social Connections: Not on file  Intimate Partner Violence: Not on file    Review of Systems:  All systems reviewed were negative except where noted in HPI.   Physical Exam: General:  Alert, well-developed, in NAD Head:  Normocephalic and atraumatic. Eyes:  Sclera clear, no icterus.   Conjunctiva pink. Ears:  Normal auditory acuity. Mouth:  No deformity or lesions.  Neck:  Supple; no masses . Lungs:  Clear throughout to auscultation.   No wheezes, crackles, or rhonchi. No acute distress. Heart:  Regular rate and rhythm; no murmurs. Abdomen:  Soft, nondistended, nontender. No masses, hepatomegaly. No obvious masses.  Normal bowel .    Rectal:  Deferred   Msk:  Symmetrical without gross deformities.. Pulses:  Normal pulses noted. Extremities:  Without edema. Neurologic:  Alert and  oriented x4;  grossly normal neurologically. Skin:  Intact without significant lesions or rashes. Cervical Nodes:  No significant cervical adenopathy. Psych:  Alert and cooperative. Normal mood and affect.  Impression / Plan:   Average risk CRC screening with colonoscopy.  Pricilla Riffle. Fuller Plan  07/18/2022, 2:59 PM See Shea Evans, [facility], to contact our on call provider

## 2022-07-18 NOTE — Op Note (Signed)
Brian Reyes Patient Name: Brian Reyes Procedure Date: 07/18/2022 2:50 PM MRN: [id number] Endoscopist: Ladene Artist , MD Age: 46 Referring MD:  Date of Birth: [birth year] Gender: Male Account #: [id number] Procedure:                Colonoscopy Indications:              Screening for colorectal malignant neoplasm Medicines:                Monitored Anesthesia Care Procedure:                Pre-Anesthesia Assessment:                           - Prior to the procedure, a History and Physical                            was performed, and patient medications and                            allergies were reviewed. The patient's tolerance of                            previous anesthesia was also reviewed. The risks                            and benefits of the procedure and the sedation                            options and risks were discussed with the patient.                            All questions were answered, and informed consent                            was obtained. Prior Anticoagulants: The patient has                            taken no previous anticoagulant or antiplatelet                            agents. ASA Grade Assessment: II - A patient with                            mild systemic disease. After reviewing the risks                            and benefits, the patient was deemed in                            satisfactory condition to undergo the procedure.                           After obtaining informed consent, the colonoscope  was passed under direct vision. Throughout the                            procedure, the patient's blood pressure, pulse, and                            oxygen saturations were monitored continuously. The                            CF [id number] #[id number] was introduced through the anus                            and advanced to the the cecum, identified by                            appendiceal orifice and  ileocecal valve. The                            ileocecal valve, appendiceal orifice, and rectum                            were photographed. The quality of the bowel                            preparation was good. The colonoscopy was performed                            without difficulty. The patient tolerated the                            procedure well. Scope In: 3:08:54 PM Scope Out: 3:26:11 PM Scope Withdrawal Time: 0 hours 15 minutes 49 seconds  Total Procedure Duration: 0 hours 17 minutes 17 seconds  Findings:                 The perianal and digital rectal examinations were                            normal.                           Five sessile polyps were found in the rectum (1),                            sigmoid colon (1), ascending colon (2) and                            ileocecal valve (1). The polyps were 6 to 8 mm in                            size. These polyps were removed with a cold snare.                            Resection and retrieval were complete.  Internal hemorrhoids were found during                            retroflexion. The hemorrhoids were moderate and                            Grade I (internal hemorrhoids that do not prolapse).                           The exam was otherwise without abnormality on                            direct and retroflexion views. Complications:            No immediate complications. Estimated blood loss:                            None. Estimated Blood Loss:     Estimated blood loss: none. Impression:               - Five 6 to 8 mm polyps in the rectum, in the                            sigmoid colon, in the ascending colon and at the                            ileocecal valve, removed with a cold snare.                            Resected and retrieved.                           - Internal hemorrhoids.                           - The examination was otherwise normal on direct                             and retroflexion views. Recommendation:           - Repeat colonoscopy after studies are complete for                            surveillance based on pathology results.                           - Patient has a contact number available for                            emergencies. The signs and symptoms of potential                            delayed complications were discussed with the                            patient. Return to normal activities tomorrow.  Written discharge instructions were provided to the                            patient.                           - Resume previous diet.                           - Continue present medications.                           - Await pathology results. Ladene Artist, MD 07/18/2022 3:29:24 PM This report has been signed electronically.

## 2022-07-19 ENCOUNTER — Telehealth: Payer: Self-pay | Admitting: *Deleted

## 2022-07-19 NOTE — Telephone Encounter (Signed)
  Follow up Call-     07/18/2022    2:00 PM  Call back number  Post procedure Call Back phone  # [phone]  Permission to leave phone message Yes     Patient questions:  Do you have a fever, pain , or abdominal swelling? No. Pain Score  0 *  Have you tolerated food without any problems? Yes.    Have you been able to return to your normal activities? Yes.    Do you have any questions about your discharge instructions: Diet   No. Medications  No. Follow up visit  No.  Do you have questions or concerns about your Care? No.  Actions: * If pain score is 4 or above: No action needed, pain <4.

## 2022-08-07 ENCOUNTER — Encounter: Payer: Self-pay | Admitting: Gastroenterology
